# Patient Record
Sex: Male | Born: 1980 | Race: Black or African American | Hispanic: No | Marital: Single | Smoking: Former smoker
Health system: Southern US, Community
[De-identification: ages and names within clinical notes are randomized; demographics above are authoritative.]

## PROBLEM LIST (undated history)

## (undated) DIAGNOSIS — G8929 Other chronic pain: Secondary | ICD-10-CM

## (undated) DIAGNOSIS — W3400XA Accidental discharge from unspecified firearms or gun, initial encounter: Secondary | ICD-10-CM

## (undated) DIAGNOSIS — M25562 Pain in left knee: Secondary | ICD-10-CM

## (undated) HISTORY — PX: OTHER SURGICAL HISTORY: SHX169

---
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
## 2003-01-26 DIAGNOSIS — W3400XA Accidental discharge from unspecified firearms or gun, initial encounter: Secondary | ICD-10-CM

## 2003-01-26 HISTORY — PX: LEG SURGERY: SHX1003

## 2003-01-26 HISTORY — DX: Accidental discharge from unspecified firearms or gun, initial encounter: W34.00XA

## 2003-06-04 ENCOUNTER — Inpatient Hospital Stay (HOSPITAL_COMMUNITY): Admission: AC | Admit: 2003-06-04 | Discharge: 2003-06-07 | Payer: Self-pay

## 2003-06-04 ENCOUNTER — Encounter (INDEPENDENT_AMBULATORY_CARE_PROVIDER_SITE_OTHER): Payer: Self-pay | Admitting: Specialist

## 2004-02-21 ENCOUNTER — Emergency Department (HOSPITAL_COMMUNITY): Admission: EM | Admit: 2004-02-21 | Discharge: 2004-02-22 | Payer: Self-pay | Admitting: Emergency Medicine

## 2004-04-07 ENCOUNTER — Emergency Department (HOSPITAL_COMMUNITY): Admission: EM | Admit: 2004-04-07 | Discharge: 2004-04-07 | Payer: Self-pay | Admitting: Emergency Medicine

## 2004-05-20 ENCOUNTER — Emergency Department (HOSPITAL_COMMUNITY): Admission: EM | Admit: 2004-05-20 | Discharge: 2004-05-20 | Payer: Self-pay | Admitting: Emergency Medicine

## 2004-10-10 ENCOUNTER — Emergency Department (HOSPITAL_COMMUNITY): Admission: EM | Admit: 2004-10-10 | Discharge: 2004-10-10 | Payer: Self-pay | Admitting: Emergency Medicine

## 2004-11-20 ENCOUNTER — Emergency Department (HOSPITAL_COMMUNITY): Admission: EM | Admit: 2004-11-20 | Discharge: 2004-11-20 | Payer: Self-pay | Admitting: Emergency Medicine

## 2004-12-07 ENCOUNTER — Emergency Department (HOSPITAL_COMMUNITY): Admission: EM | Admit: 2004-12-07 | Discharge: 2004-12-07 | Payer: Self-pay | Admitting: Emergency Medicine

## 2005-01-02 ENCOUNTER — Emergency Department (HOSPITAL_COMMUNITY): Admission: EM | Admit: 2005-01-02 | Discharge: 2005-01-02 | Payer: Self-pay | Admitting: Emergency Medicine

## 2005-01-14 ENCOUNTER — Emergency Department (HOSPITAL_COMMUNITY): Admission: EM | Admit: 2005-01-14 | Discharge: 2005-01-14 | Payer: Self-pay | Admitting: Emergency Medicine

## 2005-02-17 ENCOUNTER — Emergency Department (HOSPITAL_COMMUNITY): Admission: EM | Admit: 2005-02-17 | Discharge: 2005-02-17 | Payer: Self-pay | Admitting: Emergency Medicine

## 2005-03-04 ENCOUNTER — Emergency Department (HOSPITAL_COMMUNITY): Admission: EM | Admit: 2005-03-04 | Discharge: 2005-03-04 | Payer: Self-pay | Admitting: Emergency Medicine

## 2005-04-08 ENCOUNTER — Emergency Department (HOSPITAL_COMMUNITY): Admission: EM | Admit: 2005-04-08 | Discharge: 2005-04-08 | Payer: Self-pay | Admitting: Emergency Medicine

## 2005-04-21 ENCOUNTER — Emergency Department (HOSPITAL_COMMUNITY): Admission: EM | Admit: 2005-04-21 | Discharge: 2005-04-21 | Payer: Self-pay | Admitting: Emergency Medicine

## 2005-07-15 ENCOUNTER — Emergency Department (HOSPITAL_COMMUNITY): Admission: EM | Admit: 2005-07-15 | Discharge: 2005-07-15 | Payer: Self-pay | Admitting: Emergency Medicine

## 2005-08-28 ENCOUNTER — Emergency Department (HOSPITAL_COMMUNITY): Admission: EM | Admit: 2005-08-28 | Discharge: 2005-08-28 | Payer: Self-pay | Admitting: Emergency Medicine

## 2005-09-12 ENCOUNTER — Emergency Department (HOSPITAL_COMMUNITY): Admission: EM | Admit: 2005-09-12 | Discharge: 2005-09-12 | Payer: Self-pay | Admitting: Emergency Medicine

## 2005-11-11 ENCOUNTER — Emergency Department (HOSPITAL_COMMUNITY): Admission: EM | Admit: 2005-11-11 | Discharge: 2005-11-11 | Payer: Self-pay | Admitting: Emergency Medicine

## 2005-12-09 ENCOUNTER — Emergency Department (HOSPITAL_COMMUNITY): Admission: EM | Admit: 2005-12-09 | Discharge: 2005-12-09 | Payer: Self-pay | Admitting: Emergency Medicine

## 2005-12-15 ENCOUNTER — Emergency Department (HOSPITAL_COMMUNITY): Admission: EM | Admit: 2005-12-15 | Discharge: 2005-12-15 | Payer: Self-pay | Admitting: Emergency Medicine

## 2006-01-07 ENCOUNTER — Emergency Department (HOSPITAL_COMMUNITY): Admission: EM | Admit: 2006-01-07 | Discharge: 2006-01-07 | Payer: Self-pay | Admitting: Emergency Medicine

## 2006-01-17 ENCOUNTER — Emergency Department (HOSPITAL_COMMUNITY): Admission: EM | Admit: 2006-01-17 | Discharge: 2006-01-17 | Payer: Self-pay | Admitting: Emergency Medicine

## 2006-02-22 ENCOUNTER — Emergency Department (HOSPITAL_COMMUNITY): Admission: EM | Admit: 2006-02-22 | Discharge: 2006-02-22 | Payer: Self-pay | Admitting: Emergency Medicine

## 2006-05-27 ENCOUNTER — Emergency Department (HOSPITAL_COMMUNITY): Admission: EM | Admit: 2006-05-27 | Discharge: 2006-05-27 | Payer: Self-pay | Admitting: Emergency Medicine

## 2006-08-14 ENCOUNTER — Emergency Department (HOSPITAL_COMMUNITY): Admission: EM | Admit: 2006-08-14 | Discharge: 2006-08-14 | Payer: Self-pay | Admitting: *Deleted

## 2006-12-26 ENCOUNTER — Emergency Department (HOSPITAL_COMMUNITY): Admission: EM | Admit: 2006-12-26 | Discharge: 2006-12-26 | Payer: Self-pay | Admitting: Emergency Medicine

## 2007-11-23 ENCOUNTER — Emergency Department (HOSPITAL_COMMUNITY): Admission: EM | Admit: 2007-11-23 | Discharge: 2007-11-23 | Payer: Self-pay | Admitting: Emergency Medicine

## 2009-11-07 ENCOUNTER — Emergency Department (HOSPITAL_COMMUNITY)
Admission: EM | Admit: 2009-11-07 | Discharge: 2009-11-07 | Payer: Self-pay | Source: Home / Self Care | Admitting: Family Medicine
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
## 2010-06-21 ENCOUNTER — Emergency Department (HOSPITAL_COMMUNITY)
Admission: EM | Admit: 2010-06-21 | Discharge: 2010-06-21 | Disposition: A | Discharge status: Home / Self Care | Payer: Self-pay | Attending: Emergency Medicine | Admitting: Emergency Medicine

## 2010-06-21 ENCOUNTER — Emergency Department (HOSPITAL_COMMUNITY): Payer: Self-pay

## 2010-06-21 DIAGNOSIS — M25569 Pain in unspecified knee: Secondary | ICD-10-CM | POA: Insufficient documentation

## 2010-06-21 DIAGNOSIS — X500XXA Overexertion from strenuous movement or load, initial encounter: Secondary | ICD-10-CM | POA: Insufficient documentation

## 2010-06-21 DIAGNOSIS — M25469 Effusion, unspecified knee: Secondary | ICD-10-CM | POA: Insufficient documentation

## 2011-03-26 ENCOUNTER — Emergency Department (HOSPITAL_COMMUNITY)
Admission: EM | Admit: 2011-03-26 | Discharge: 2011-03-26 | Disposition: A | Discharge status: Home / Self Care | Payer: Self-pay | Attending: Emergency Medicine | Admitting: Emergency Medicine

## 2011-03-26 ENCOUNTER — Encounter (HOSPITAL_COMMUNITY): Payer: Self-pay | Admitting: *Deleted

## 2011-03-26 DIAGNOSIS — IMO0001 Reserved for inherently not codable concepts without codable children: Secondary | ICD-10-CM

## 2011-03-26 DIAGNOSIS — Z139 Encounter for screening, unspecified: Secondary | ICD-10-CM | POA: Insufficient documentation
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
## 2011-03-26 NOTE — ED Notes (Signed)
NP to bedside
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
## 2011-03-26 NOTE — Discharge Instructions (Signed)
Safer Sex Your caregiver wants you to have this information about the infections that can be transmitted from sexual contact and how to prevent them. The idea behind safer sex is that you can be sexually active, and at the same time reduce the risk of giving or getting a sexually transmitted disease (STD). Every person should be aware of how to prevent him or herself and his or her sex partner from getting an STD. CAUSES OF STDS STDs are transmitted by sharing body fluids, which contain viruses and bacteria. The following fluids all transmit infections during sexual intercourse and sex acts:  Semen.   Saliva.   Urine.   Blood.   Vaginal mucus.  Examples of STDs include:  Chlamydia.   Gonorrhea.   Genital herpes.   Hepatitis B.   Human immunodeficiency virus or acquired immunodeficiency syndrome (HIV or AIDS).   Syphilis.   Trichomonas.   Pubic lice.   Human papillomavirus (HPV), which may include:   Genital warts.   Cervical dysplasia.   Cervical cancer (can develop with certain types of HPV).  SYMPTOMS  Sexual diseases often cause few or no symptoms until they are advanced, so a person can be infected and spread the infection without knowing it. Some STDs respond to treatment very well. Others, like HIV and herpes, cannot be cured, but are treated to reduce their effects. Specific symptoms include:  Abnormal vaginal discharge.   Irritation or itching in and around the vagina, and in the pubic hair.   Pain during sexual intercourse.   Bleeding during sexual intercourse.   Pelvic or abdominal pain.   Fever.   Growths in and around the vagina.   An ulcer in or around the vagina.   Swollen glands in the groin area.  DIAGNOSIS   Blood tests.   Pap test.   Culture test of abnormal vaginal discharge.   A test that applies a solution and examines the cervix with a lighted magnifying scope (colposcopy).   A test that examines the pelvis with a lighted  tube, through a small incision (laparoscopy).  TREATMENT  The treatment will depend on the cause of the STD.  Antibiotic treatment by injection, oral, creams, or suppositories in the vagina.   Over-the-counter medicated shampoo, to get rid of pubic lice.   Removing or treating growths with medicine, freezing, burning (electrocautery), or surgery.   Surgery treatment for HPV of the cervix.   Supportive medicines for herpes, HIV, AIDS, and hepatitis.  Being careful cannot eliminate all risk of infection, but sex can be made much safer. Safe sexual practices include body massage and gentle touching. Masturbation is safe, as long as body fluids do not contact skin that has sores or cuts. Dry kissing and oral sex on a man wearing a latex condom or on a woman wearing a male condom is also safe. Slightly less safe is intercourse while the man wears a latex condom or wet kissing. It is also safer to have one sex partner that you know is not having sex with anyone else. LENGTH OF ILLNESS An STD might be treated and cured in a week, sometimes a month, or more. And it can linger with symptoms for many years. STDs can also cause damage to the male organs. This can cause chronic pain, infertility, and recurrence of the STD, especially herpes, hepatitis, HIV, and HPV. HOME CARE INSTRUCTIONS AND PREVENTION  Alcohol and recreational drugs are often the reason given for not practicing safer sex. These substances affect  your judgment. Alcohol and recreational drugs can also impair your immune system, making you more vulnerable to disease.   Do not engage in risky and dangerous sexual practices, including:   Vaginal or anal sex without a condom.   Oral sex on a man without a condom.   Oral sex on a woman without a male condom.   Using saliva to lubricate a condom.   Any other sexual contact in which body fluids or blood from one partner contact the other partner.   You should use only latex  condoms for men and water soluble lubricants. Petroleum based lubricants or oils used to lubricate a condom will weaken the condom and increase the chance that it will break.   Think very carefully before having sex with anyone who is high risk for STDs and HIV. This includes IV drug users, people with multiple sexual partners, or people who have had an STD, or a positive hepatitis or HIV blood test.   Remember that even if your partner has had only one previous partner, their previous partner might have had multiple partners. If so, you are at high risk of being exposed to an STD. You and your sex partner should be the only sex partners with each other, with no one else involved.   A vaccine is available for hepatitis B and HPV through your caregiver or the Public Health Department. Everyone should be vaccinated with these vaccines.   Avoid risky sex practices. Sex acts that can break the skin make you more likely to get an STD.  SEEK MEDICAL CARE IF:   If you think you have an STD, even if you do not have any symptoms. Contact your caregiver for evaluation and treatment, if needed.   You think or know your sex partner has acquired an STD.   You have any of the symptoms mentioned above.  Document Released: 02/19/2004 Document Revised: 09/23/2010 Document Reviewed: 12/11/2008 [facility] Patient Information 2012 [place], [place].  RESOURCE GUIDE  Dental Problems  Patients with Medicaid: [facility] [phone] [address]                                           [phone] [address] Phone:  [phone]                                                  Phone:  [phone]  If unable to pay or uninsured, contact:  [facility] or [facility]. to become qualified for the adult dental clinic.  Chronic Pain Problems Contact [facility]  [phone] Patients need to be referred by their primary care  doctor.  Insufficient Money for Medicine Contact United Way:  call "211" or [facility] (986)080-6964.  No Primary Care Doctor Call [facility]  3644114281 Other agencies that provide inexpensive medical care    [facility]  [phone]    [facility]  [phone]    [facility]  432-039-9188    [facility]  [phone]    [facility]  331-310-8085  [facility]  9291125825  Psychological Services [facility]  [phone] [facility]  [phone] [facility]   [phone] (emergency services 631-786-1980)  Substance Abuse Resources [facility]  (445)588-5187 [facility] (720)496-0482 [facility] 228-594-7861 Floyd[facility] Flock 912-588-6016 Residential & Outpatient Substance Abuse Program  [phone]  Abuse/Neglect [facility] Child Abuse Hotline 803-625-8599 [facility] Child Abuse Hotline 820-576-2341 (After Hours)  Emergency Shelter [facility] [phone]  [facility] (605)535-8671 [facility] 863-673-9772  MRSA Hotline #:   7251115697    [facility] Resources  [facility] [place]     United Way                          [facility] Dept. [address] [place]                       [address]      [address]  Blondell Reveal Phone:  [phone]                                   Phone:  [phone]                 Phone:  [phone]  [facility] Phone:  [phone]  [facility] Child Abuse Hotline 306-581-4591 5417240162 (After Hours)

## 2011-03-27 NOTE — ED Provider Notes (Signed)
Medical screening examination/treatment/procedure(s) were performed by non-physician practitioner and as supervising physician I was immediately available for consultation/collaboration.  Teandre Hamre, MD 03/27/11 0058 

## 2011-04-10 ENCOUNTER — Encounter (HOSPITAL_COMMUNITY): Payer: Self-pay | Admitting: *Deleted

## 2011-04-10 ENCOUNTER — Emergency Department (HOSPITAL_COMMUNITY)
Admission: EM | Admit: 2011-04-10 | Discharge: 2011-04-11 | Disposition: A | Discharge status: Home / Self Care | Payer: Self-pay | Attending: Emergency Medicine | Admitting: Emergency Medicine

## 2011-04-10 DIAGNOSIS — F17200 Nicotine dependence, unspecified, uncomplicated: Secondary | ICD-10-CM | POA: Insufficient documentation

## 2011-04-10 DIAGNOSIS — Z043 Encounter for examination and observation following other accident: Secondary | ICD-10-CM | POA: Insufficient documentation

## 2011-04-10 DIAGNOSIS — M549 Dorsalgia, unspecified: Secondary | ICD-10-CM | POA: Insufficient documentation
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
## 2011-04-11 MED ORDER — OXYCODONE-ACETAMINOPHEN 5-325 MG PO TABS: 1.0000 | ORAL_TABLET | ORAL | Status: AC | PRN

## 2011-04-11 NOTE — Discharge Instructions (Signed)
RESOURCE GUIDE  Dental Problems  Patients with Medicaid: [facility]                     [facility] [address]                                           [address] Phone:  [phone]                                                  Phone:  [phone]  If unable to pay or uninsured, contact:  [facility] or [facility] to become qualified for the adult dental clinic.  Chronic Pain Problems Contact [facility]  [phone] Patients need to be referred by their primary care doctor.  Insufficient Money for Medicine Contact United Way:  call "211" or [facility] 271-5999.  No Primary Care Doctor Call [facility]  832-8000 Other agencies that provide inexpensive medical care    [facility]  [phone]    [facility]  [phone]    [facility]  [phone]    Women's Clinic  [phone]    [facility]  [phone]    [facility]  [phone]  Psychological Services [facility]  [phone] [facility]  [phone] [facility]   [phone] (emergency services 641-4993)  Substance Abuse Resources [facility]  336-882-2125 [facility] 336-784-9470 [facility] 336-285-9073 [facility] 336-845-3988 Residential & Outpatient Substance Abuse Program  800-659-3381  Abuse/Neglect [place] Child Abuse Hotline (336) 641-3795 [place] Child Abuse Hotline 800-378-5315 (After Hours)  Emergency Shelter [facility] (336) 271-5985  [facility] (336) 275-9566 [facility] (704) [phone]  MRSA Hotline #:   832-7006    [place] Resources  [facility] [place]     United Way                          [facility] [address] [place]                       [address]      [address]  [place]                                                [place]                            [place] Phone:  [phone]                                   Phone:  [phone]                 Phone:  [phone]  [facility] Phone:  [phone]    [place] Child Abuse Hotline (336) 342-1394 (336) 342-3537 (After Hours)    SEEK IMMEDIATE MEDICAL ATTENTION IF: New numbness, tingling, weakness, or problem with the use of your arms or legs.  Severe back pain not relieved with medications.  Change in bowel or bladder control (if you lose control of stool or urine, or if you are unable to urinate) Increasing pain in any areas of the body (such as chest or abdominal pain).  Shortness of breath, dizziness or fainting.  Nausea (feeling sick to your stomach), vomiting, fever, or sweats.  
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
## 2012-03-10 ENCOUNTER — Encounter (HOSPITAL_COMMUNITY): Payer: Self-pay

## 2012-03-10 ENCOUNTER — Emergency Department (HOSPITAL_COMMUNITY): Payer: Self-pay

## 2012-03-10 ENCOUNTER — Emergency Department (HOSPITAL_COMMUNITY)
Admission: EM | Admit: 2012-03-10 | Discharge: 2012-03-10 | Disposition: A | Discharge status: Home / Self Care | Payer: Self-pay | Attending: Emergency Medicine | Admitting: Emergency Medicine

## 2012-03-10 DIAGNOSIS — T304 Corrosion of unspecified body region, unspecified degree: Secondary | ICD-10-CM

## 2012-03-10 DIAGNOSIS — Y93E5 Activity, floor mopping and cleaning: Secondary | ICD-10-CM | POA: Insufficient documentation

## 2012-03-10 DIAGNOSIS — T23129A Burn of first degree of unspecified single finger (nail) except thumb, initial encounter: Secondary | ICD-10-CM | POA: Insufficient documentation

## 2012-03-10 DIAGNOSIS — IMO0002 Reserved for concepts with insufficient information to code with codable children: Secondary | ICD-10-CM | POA: Insufficient documentation

## 2012-03-10 DIAGNOSIS — Y92009 Unspecified place in unspecified non-institutional (private) residence as the place of occurrence of the external cause: Secondary | ICD-10-CM | POA: Insufficient documentation

## 2012-03-10 DIAGNOSIS — Z87828 Personal history of other (healed) physical injury and trauma: Secondary | ICD-10-CM | POA: Insufficient documentation

## 2012-03-10 DIAGNOSIS — F17200 Nicotine dependence, unspecified, uncomplicated: Secondary | ICD-10-CM | POA: Insufficient documentation
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
## 2012-03-13 NOTE — ED Provider Notes (Signed)
Medical screening examination/treatment/procedure(s) were performed by non-physician practitioner and as supervising physician I was immediately available for consultation/collaboration.  Raeford Razor, MD 03/13/12 [phone]

## 2012-09-14 ENCOUNTER — Encounter (HOSPITAL_COMMUNITY): Payer: Self-pay | Admitting: *Deleted

## 2012-09-14 ENCOUNTER — Emergency Department (HOSPITAL_COMMUNITY)
Admission: EM | Admit: 2012-09-14 | Discharge: 2012-09-14 | Disposition: A | Discharge status: Home / Self Care | Payer: Self-pay | Attending: Emergency Medicine | Admitting: Emergency Medicine

## 2012-09-14 ENCOUNTER — Emergency Department (HOSPITAL_COMMUNITY): Payer: Self-pay

## 2012-09-14 DIAGNOSIS — M25569 Pain in unspecified knee: Secondary | ICD-10-CM | POA: Insufficient documentation

## 2012-09-14 DIAGNOSIS — M25469 Effusion, unspecified knee: Secondary | ICD-10-CM | POA: Insufficient documentation

## 2012-09-14 DIAGNOSIS — M25562 Pain in left knee: Secondary | ICD-10-CM

## 2012-09-14 DIAGNOSIS — Z87828 Personal history of other (healed) physical injury and trauma: Secondary | ICD-10-CM | POA: Insufficient documentation

## 2012-09-14 DIAGNOSIS — F17200 Nicotine dependence, unspecified, uncomplicated: Secondary | ICD-10-CM | POA: Insufficient documentation

## 2012-09-14 HISTORY — DX: Accidental discharge from unspecified firearms or gun, initial encounter: W34.00XA

## 2012-09-14 MED ORDER — TRAMADOL HCL 50 MG PO TABS: 50.0000 mg | ORAL_TABLET | Freq: Four times a day (QID) | ORAL | Status: DC | PRN
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
## 2012-09-14 NOTE — ED Notes (Signed)
Pt states pain and swelling to L knee with weight bearing activity.  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

## 2012-09-16 NOTE — ED Provider Notes (Signed)
Medical screening examination/treatment/procedure(s) were performed by non-physician practitioner and as supervising physician I was immediately available for consultation/collaboration.  Luree Palla, MD 09/16/12 1604 

## 2012-12-15 ENCOUNTER — Emergency Department (HOSPITAL_COMMUNITY)
Admission: EM | Admit: 2012-12-15 | Discharge: 2012-12-15 | Disposition: A | Discharge status: Home / Self Care | Payer: Self-pay | Attending: Emergency Medicine | Admitting: Emergency Medicine

## 2012-12-15 ENCOUNTER — Encounter (HOSPITAL_COMMUNITY): Payer: Self-pay | Admitting: Emergency Medicine

## 2012-12-15 DIAGNOSIS — R9431 Abnormal electrocardiogram [ECG] [EKG]: Secondary | ICD-10-CM | POA: Insufficient documentation

## 2012-12-15 DIAGNOSIS — R209 Unspecified disturbances of skin sensation: Secondary | ICD-10-CM | POA: Insufficient documentation

## 2012-12-15 DIAGNOSIS — M79609 Pain in unspecified limb: Secondary | ICD-10-CM | POA: Insufficient documentation

## 2012-12-15 DIAGNOSIS — R200 Anesthesia of skin: Secondary | ICD-10-CM

## 2012-12-15 DIAGNOSIS — F17200 Nicotine dependence, unspecified, uncomplicated: Secondary | ICD-10-CM | POA: Insufficient documentation

## 2012-12-15 LAB — POCT I-STAT TROPONIN I: Troponin i, poc: 0 ng/mL (ref 0.00–0.08)

## 2012-12-15 LAB — POCT I-STAT, CHEM 8
BUN: 8 mg/dL (ref 6–23)
Calcium, Ion: 1.19 mmol/L (ref 1.12–1.23)
TCO2: 23 mmol/L (ref 0–100)
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
## 2012-12-15 NOTE — ED Notes (Signed)
To ED from home via [facility], c/o left arm numbness (no focal neuro deficits per EMS) and nausea after swallowing tabacco, VSS, A/O X4, ambulatory and in NAD
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
## 2013-02-27 ENCOUNTER — Emergency Department (HOSPITAL_COMMUNITY): Payer: Self-pay

## 2013-02-27 ENCOUNTER — Encounter (HOSPITAL_COMMUNITY): Payer: Self-pay | Admitting: Emergency Medicine

## 2013-02-27 ENCOUNTER — Emergency Department (HOSPITAL_COMMUNITY)
Admission: EM | Admit: 2013-02-27 | Discharge: 2013-02-27 | Disposition: A | Discharge status: Home / Self Care | Payer: Self-pay | Attending: Emergency Medicine | Admitting: Emergency Medicine

## 2013-02-27 DIAGNOSIS — M25462 Effusion, left knee: Secondary | ICD-10-CM

## 2013-02-27 DIAGNOSIS — IMO0001 Reserved for inherently not codable concepts without codable children: Secondary | ICD-10-CM | POA: Insufficient documentation

## 2013-02-27 DIAGNOSIS — M25469 Effusion, unspecified knee: Secondary | ICD-10-CM | POA: Insufficient documentation

## 2013-02-27 DIAGNOSIS — F17200 Nicotine dependence, unspecified, uncomplicated: Secondary | ICD-10-CM | POA: Insufficient documentation

## 2013-02-27 DIAGNOSIS — Z87828 Personal history of other (healed) physical injury and trauma: Secondary | ICD-10-CM | POA: Insufficient documentation

## 2013-02-27 MED ORDER — IBUPROFEN 800 MG PO TABS: 800.0000 mg | ORAL_TABLET | Freq: Three times a day (TID) | ORAL | Status: DC

## 2013-02-27 MED ORDER — HYDROCODONE-ACETAMINOPHEN 5-325 MG PO TABS
1.0000 | ORAL_TABLET | Freq: Once | ORAL | Status: AC
  Administered 2013-02-27: 1 via ORAL
  Filled 2013-02-27: qty 1

## 2013-02-27 MED ORDER — OXYCODONE-ACETAMINOPHEN 5-325 MG PO TABS: 2.0000 | ORAL_TABLET | ORAL | Status: DC | PRN

## 2013-02-27 NOTE — ED Provider Notes (Signed)
Medical screening examination/treatment/procedure(s) were performed by non-physician practitioner and as supervising physician I was immediately available for consultation/collaboration.  Shanna CiscoMegan E Ouida Abeyta, MD 02/27/13 2055

## 2013-02-27 NOTE — ED Provider Notes (Signed)
I was requested to perform therapeutic asperation of L knee.  After consent was obtained, using sterile technique the L knee was prepped and 3 ml's of 2% plain Lidocaine used to anesthetize the needle tract into the joint from the medial infrapatellar approach, and then also lateral infrapatellar approach. The knee joint was entered and 45 ml's of hay colored fluid mixed with bloody was withdrawn (30ml from lateral infrapetelar and 15ml from medial infrapattelar region). 6 ml plain Lidocaine was then injected and the needle withdrawn.  The procedure was well tolerated. This is a therapeutic tap, no fluid analysis performed. Does not look infected.  The patient is asked to continue to rest the knee for a few more days before resuming regular activities.  It may be more painful for the first 1-2 days.  Watch for fever, or increased swelling or persistent pain in knee. Call or return to clinic prn if such symptoms occur or the knee fails to improve as anticipated.   BP 128/76  Pulse 80  Temp(Src) 98.2 F (36.8 C) (Oral)  Resp 15  Ht 5\' 9"  (1.753 m)  Wt 145 lb (65.772 kg)  BMI 21.40 kg/m2  SpO2 99%  I have reviewed nursing notes and vital signs. I personally reviewed the imaging tests through PACS system  I reviewed available ER/hospitalization records thought the EMR  Results for orders placed during the hospital encounter of 12/15/12  POCT I-STAT, CHEM 8      Result Value Range   Sodium 139  135 - 145 mEq/L   Potassium 4.2  3.5 - 5.1 mEq/L   Chloride 102  96 - 112 mEq/L   BUN 8  6 - 23 mg/dL   Creatinine, Ser 1.611.20  0.50 - 1.35 mg/dL   Glucose, Bld 096116 (*) 70 - 99 mg/dL   Calcium, Ion 0.451.19  4.091.12 - 1.23 mmol/L   TCO2 23  0 - 100 mmol/L   Hemoglobin 15.6  13.0 - 17.0 g/dL   HCT 81.146.0  91.439.0 - 78.252.0 %  POCT I-STAT TROPONIN I      Result Value Range   Troponin i, poc 0.00  0.00 - 0.08 ng/mL   Comment 3            Dg Knee Complete 4 Views Left  02/27/2013   CLINICAL DATA:  Superior anterior  left knee pain no known injury  EXAM: LEFT KNEE - COMPLETE 4+ VIEW  COMPARISON:  DG KNEE COMPLETE 4 VIEWS*L* dated 09/14/2012  FINDINGS: There is no fracture or dislocation. There is mild narrowing and osteophyte formation of the medial and lateral compartments. There is mild to moderate patellofemoral osteophyte formation and narrowing. There is a moderate joint effusion.  IMPRESSION: Stable tricompartmental arthritis, mild to moderate, but advanced for patient age. Increase in the size of a knee joint effusion when compared to prior study.   Electronically Signed   By: Esperanza Heiraymond  Rubner M.D.   On: 02/27/2013 16:34      Fayrene HelperBowie Jewelia Bocchino, PA-C 02/27/13 1851
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
## 2013-02-27 NOTE — ED Notes (Addendum)
Pt reports left knee pain 3 months but only swelling 1 week ago. Pt can bear wt, sensation intact. Denies injury.

## 2013-02-27 NOTE — Discharge Instructions (Signed)
Please follow up closely with orthopedist specialist for further management of your knee pain and swelling.  Return if you develop fever, rash, increase redness or swelling or any signs of infection.    Knee Effusion The medical term for having fluid in your knee is effusion. This is often due to an internal derangement of the knee. This means something is wrong inside the knee. Some of the causes of fluid in the knee may be torn cartilage, a torn ligament, or bleeding into the joint from an injury. Your knee is likely more difficult to bend and move. This is often because there is increased pain and pressure in the joint. The time it takes for recovery from a knee effusion depends on different factors, including:   Type of injury.  Your age.  Physical and medical conditions.  Rehabilitation Strategies. How long you will be away from your normal activities will depend on what kind of knee problem you have and how much damage is present. Your knee has two types of cartilage. Articular cartilage covers the bone ends and lets your knee bend and move smoothly. Two menisci, thick pads of cartilage that form a rim inside the joint, help absorb shock and stabilize your knee. Ligaments bind the bones together and support your knee joint. Muscles move the joint, help support your knee, and take stress off the joint itself. CAUSES  Often an effusion in the knee is caused by an injury to one of the menisci. This is often a tear in the cartilage. Recovery after a meniscus injury depends on how much meniscus is damaged and whether you have damaged other knee tissue. Small tears may heal on their own with conservative treatment. Conservative means rest, limited weight bearing activity and muscle strengthening exercises. Your recovery may take up to 6 weeks.  TREATMENT  Larger tears may require surgery. Meniscus injuries may be treated during arthroscopy. Arthroscopy is a procedure in which your surgeon uses a  small telescope like instrument to look in your knee. Your caregiver can make a more accurate diagnosis (learning what is wrong) by performing an arthroscopic procedure. If your injury is on the inner margin of the meniscus, your surgeon may trim the meniscus back to a smooth rim. In other cases your surgeon will try to repair a damaged meniscus with stitches (sutures). This may make rehabilitation take longer, but may provide better long term result by helping your knee keep its shock absorption capabilities. Ligaments which are completely torn usually require surgery for repair. HOME CARE INSTRUCTIONS  Use crutches as instructed.  If a brace is applied, use as directed.  Once you are home, an ice pack applied to your swollen knee may help with discomfort and help decrease swelling.  Keep your knee raised (elevated) when you are not up and around or on crutches.  Only take over-the-counter or prescription medicines for pain, discomfort, or fever as directed by your caregiver.  Your caregivers will help with instructions for rehabilitation of your knee. This often includes strengthening exercises.  You may resume a normal diet and activities as directed. SEEK MEDICAL CARE IF:   There is increased swelling in your knee.  You notice redness, swelling, or increasing pain in your knee.  An unexplained oral temperature above 102 F (38.9 C) develops. SEEK IMMEDIATE MEDICAL CARE IF:   You develop a rash.  You have difficulty breathing.  You have any allergic reactions from medications you may have been given.  There is severe  pain with any motion of the knee. MAKE SURE YOU:   Understand these instructions.  Will watch your condition.  Will get help right away if you are not doing well or get worse. Document Released: 04/03/2003 Document Revised: 04/05/2011 Document Reviewed: 06/07/2007 [facility] Patient Information 2014 [place], [place].

## 2013-02-27 NOTE — ED Notes (Signed)
PA at bedside draining fluid off knee.

## 2013-02-28 NOTE — ED Provider Notes (Signed)
Medical screening examination/treatment/procedure(s) were performed by non-physician practitioner and as supervising physician I was immediately available for consultation/collaboration.  EKG Interpretation   None         Sabin Gibeault B. Tasheba Henson, MD 02/28/13 1617 

## 2013-06-29 ENCOUNTER — Encounter (HOSPITAL_COMMUNITY): Payer: Self-pay | Admitting: Emergency Medicine

## 2013-06-29 ENCOUNTER — Emergency Department (HOSPITAL_COMMUNITY)
Admission: EM | Admit: 2013-06-29 | Discharge: 2013-06-29 | Disposition: A | Discharge status: Home / Self Care | Payer: Self-pay | Attending: Emergency Medicine | Admitting: Emergency Medicine

## 2013-06-29 DIAGNOSIS — Z87828 Personal history of other (healed) physical injury and trauma: Secondary | ICD-10-CM | POA: Insufficient documentation

## 2013-06-29 DIAGNOSIS — R1310 Dysphagia, unspecified: Secondary | ICD-10-CM

## 2013-06-29 DIAGNOSIS — R1314 Dysphagia, pharyngoesophageal phase: Secondary | ICD-10-CM | POA: Insufficient documentation

## 2013-06-29 DIAGNOSIS — G8929 Other chronic pain: Secondary | ICD-10-CM | POA: Insufficient documentation

## 2013-06-29 DIAGNOSIS — R1319 Other dysphagia: Secondary | ICD-10-CM

## 2013-06-29 DIAGNOSIS — F17200 Nicotine dependence, unspecified, uncomplicated: Secondary | ICD-10-CM | POA: Insufficient documentation

## 2013-06-29 DIAGNOSIS — M25562 Pain in left knee: Secondary | ICD-10-CM

## 2013-06-29 DIAGNOSIS — N342 Other urethritis: Secondary | ICD-10-CM | POA: Insufficient documentation

## 2013-06-29 DIAGNOSIS — Z202 Contact with and (suspected) exposure to infections with a predominantly sexual mode of transmission: Secondary | ICD-10-CM

## 2013-06-29 DIAGNOSIS — M25569 Pain in unspecified knee: Secondary | ICD-10-CM | POA: Insufficient documentation

## 2013-06-29 HISTORY — DX: Other chronic pain: G89.29

## 2013-06-29 HISTORY — DX: Pain in left knee: M25.562

## 2013-06-29 MED ORDER — CEFTRIAXONE SODIUM 250 MG IJ SOLR
250.0000 mg | Freq: Once | INTRAMUSCULAR | Status: AC
  Administered 2013-06-29: 250 mg via INTRAMUSCULAR
  Filled 2013-06-29: qty 250

## 2013-06-29 MED ORDER — AZITHROMYCIN 1 G PO PACK
1.0000 g | PACK | Freq: Once | ORAL | Status: AC
  Administered 2013-06-29: 1 g via ORAL
  Filled 2013-06-29: qty 1

## 2013-06-29 MED ORDER — LIDOCAINE HCL (PF) 1 % IJ SOLN
INTRAMUSCULAR | Status: AC
  Filled 2013-06-29: qty 5
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
## 2013-06-29 NOTE — Discharge Instructions (Signed)
°Emergency Department Resource Guide °1) Find a Doctor and Pay Out of Pocket °Although you won't have to find out who is covered by your insurance plan, it is a good idea to ask around and get recommendations. You will then need to call the office and see if the doctor you have chosen will accept you as a new patient and what types of options they offer for patients who are self-pay. Some doctors offer discounts or will set up payment plans for their patients who do not have insurance, but you will need to ask so you aren't surprised when you get to your appointment. ° °2) Contact Your Local Health Department °Not all health departments have doctors that can see patients for sick visits, but many do, so it is worth a call to see if yours does. If you don't know where your local health department is, you can check in your phone book. The CDC also has a tool to help you locate your state's health department, and many state websites also have listings of all of their local health departments. ° °3) Find a Walk-in Clinic °If your illness is not likely to be very severe or complicated, you may want to try a walk in clinic. These are popping up all over the country in pharmacies, drugstores, and shopping centers. They're usually staffed by nurse practitioners or physician assistants that have been trained to treat common illnesses and complaints. They're usually fairly quick and inexpensive. However, if you have serious medical issues or chronic medical problems, these are probably not your best option. ° °No Primary Care Doctor: °- Call [facility] at  832-8000 - they can help you locate a primary care doctor that  accepts your insurance, provides certain services, etc. °- Physician Referral Service- 1-800-533-3463 ° °Chronic Pain Problems: °Organization         Address  Phone   Notes  °[facility]  (336) 297-2271 Patients need to be referred by their primary care doctor.  ° °Medication  Assistance: °Organization         Address  Phone   Notes  °[place] [facility] [address] °[place], [place] [address] (336) 641-8030 --Must be a resident of [place] °-- Must have NO insurance coverage whatsoever (no Medicaid/ Medicare, etc.) °-- The pt. MUST have a primary care doctor that directs their care regularly and follows them in the community °  °[facility]  (866) 331-1348   °[employer]  (888) 892-1162   ° °Agencies that provide inexpensive medical care: °Organization         Address  Phone   Notes  °[facility]  (336) 832-8035   °[facility]    (336) 832-7272   °[facility] [address] °[place], [place] [address] (336) 832-4777   °[facility] [address], °[place] (336) 271-4999   °[facility]    (336) 373-0678   °[facility]    (336) 272-1050   °[facility] ° [address], [place] Phone:  (336) 832-4444, Fax:  (336) 832-4440 Hours of Operation:  9 am - 6 pm, M-F.  Also accepts Medicaid/Medicare and self-pay.  °[facility] ° [address], [place] Phone: (336) 832-3150, Fax: (336) [phone]. Hours of Operation:  8:30 am - 5:30 pm, M-F.  Also accepts Medicaid and self-pay.  °[facility] [place] [address], [place] Phone: (336) 878-6027   °[facility] [address], [place], [place] (336)723-1848, Ext. 123 Mondays & Thursdays: 7-9 AM.  First 15 patients are seen on a first come, first serve basis. °  ° °Medicaid-accepting [place] Providers: ° °Organization         Address  Phone   Notes  °[facility] [address], [place] (336) 641-2100 Also accepts self-pay patients.  °[facility] [address], [place] ° (336) 856-9996   °[facility] [address], [place]  (336) 288-8857   °[facility] [address] [place] [address], [place] (336) 299-7000   °Veita Bland [address], [place]  ° (336) 373-1557 Only accepts [place] Access Medicaid patients after they have their name applied to their card.  ° °Self-Pay (no insurance) in [place]: ° °Organization         Address  Phone   Notes  °Sickle Cell Patients, [facility] [address], [place] (336) 832-1970   °[facility] [address], [place] (336) 832-4400   °[facility] ° [address], [place] (336) 992-4800   °[facility]/Dr. Osei-Bonsu ° [address] [place] [address], [place] or [address], [place] (336) 841-8500 Phone number for both [place] and [place] locations is the same.  °[facility] [address], [place] (336) 299-0000   °[facility] [address] [place] [address], [place] or [address] (336) 852-7530 °(336) 878-2260   °[facility] [address], [place] (336) 350-1642, phone; (336) 294-5005, fax Sees patients 1st and 3rd Saturday of every month.  Must not qualify for public or private insurance (i.e. Medicaid, Medicare, [place] Health Choice, Veterans' Benefits) • Household income should be no more than 200% of the poverty level •The clinic cannot treat you if you are pregnant or think you are pregnant • Sexually transmitted diseases are not treated at the clinic.  ° ° °Dental Care: °Organization         Address  Phone  Notes  °[place] [facility] [address], [place] (336) 641-6152 Accepts children up to age 21 who are enrolled in Medicaid or [place] Health Choice; pregnant women with a Medicaid card; and children who have applied for Medicaid or [place] Health Choice, but were declined, whose parents can pay a reduced fee at time of service.  °[place]  [facility] [place]  [address], [place] (336) 641-7733 Accepts children up to age 21 who are enrolled in Medicaid or [place] Health Choice; pregnant women with a Medicaid card; and children who have applied for Medicaid or [place] Health Choice, but were declined, whose parents can pay a reduced fee at time of service.  °[facility] ° [address], [place] (336) 641-4533 Patients are seen by appointment only. Walk-ins are not accepted. [facility] will see patients 18 years of age and older. °Monday - Tuesday (8am-5pm) °Most Wednesdays (8:30-5pm) °$30 per visit, cash only  °[facility] ° [address], [place] (336) 641-4533 Patients are seen by appointment only. Walk-ins are not accepted. [facility] will see patients 18 years of age and older. °One   Wednesday Evening (Monthly: Volunteer Based).  $30 per visit, cash only  °[facility]  (919) 537-3737 for adults; Children under age 4, call Graduate Pediatric Dentistry at (919) [phone]. Children aged 4-14, please call (919) [phone] to request a pediatric application. ° Dental services are provided in all areas of dental care including fillings, crowns and bridges, complete and partial dentures, implants, gum treatment, root canals, and extractions. Preventive care is also provided. Treatment is provided to both adults and children. °Patients are selected via a lottery and there is often a waiting list. °  °[facility] [address], °[place] ° (336) 763-8833 [email or web address] °  °[facility] [address], [place], [place] (336)723-1848, Ext. 123 Second and Fourth Thursday of each month, opens at 6:30 AM; Clinic ends at 9 AM.  Patients are seen on a first-come first-served basis, and a limited number are seen during each clinic.  ° °[facility] ° [address], [place], [place] (336) 723-7904    Eligibility Requirements °You must have lived in [place], [place], or [place] counties for at least the last three months. °  You cannot be eligible for state or federal sponsored healthcare insurance, including Veterans Administration, Medicaid, or Medicare. °  You generally cannot be eligible for healthcare insurance through your employer.  °  How to apply: °Eligibility screenings are held every Tuesday and Wednesday afternoon from 1:00 pm until 4:00 pm. You do not need an appointment for the interview!  °[facility] [address], [place], [place] 336-631-2330   °[facility]  336-342-8273   °[facility]  336-703-3100   °[facility]  336-570-6415   ° °Behavioral Health Resources in the Community: °Intensive Outpatient Programs °Organization         Address  Phone  Notes  °[facility] [address], [place], [place] 336-878-6098   °[facility] [address], [place], [place] 336-832-9800   °[facility] [address], [place], [place] ° 336-882-2125   °[facility] [address],  °[place], [place] 1-800-853-5163 or 336-641-4981   °Substance Abuse Resources °Organization         Address  Phone  Notes  °[facility]  336-882-2125   °[facility]  336-784-9470   °[facility]  336-285-9073   °[facility]  336-845-3988   °Residential & Outpatient Substance Abuse Program  1-800-659-3381   °Psychological Services °Organization         Address  Phone  Notes  °[facility]  336- 832-9600   °[facility]  336- 378-7881   °[facility] [address], [place] 1-800-853-5163 or 336-641-4981   ° °Mobile Crisis Teams °Organization         Address  Phone  Notes  °[facility], Mobile Crisis Care Unit  1-877-626-1772   °Assertive °[facility] ° [address]  [place], [place] 336-834-9664   °Sharon DeEsch [address] °[place] 336-554-5454   ° °Self-Help/Support Groups °Organization         Address  Phone             Notes  °[facility]  - variety of support groups  336- 373-1402 Call for more information  °Narcotics Anonymous (NA), [facility] [address], °[place]  2 meetings at this location  ° °  Residential Treatment Programs Organization         Address  Phone  Notes  [facility] [address],    [place]  7-829-562-1308   [facility]  [address] [place] [address], [place], [place] 962-952-8413   [facility] [address] [place], [place] 244-010-2725 Admissions: 8am-3pm M-F  [facility] [address]    [place], [place] 366-440-3474   [facility] [address] [place], [place], [place] 259-563-8756   [facility] [address] [place] [address]  [place], [place] 433-295-1884   [facility] Intensive Outpatient [address], [place], [place] 166-063-0160   [facility] [address] [place]  [place], [place] 1-093-235-5732 or (573)491-2527   [facility] [address] [place], [place] 376-283-1517 Accepts Medicaid  [facility] [address]  [place] 6-160-737-1062 Substance Abuse/Addiction Treatment   [facility]         Address  Phone  Notes  [facility]  (575) 574-2713   Angie Fava, PhD [address] [place], [place]   (409) 214-1217 or 607 060 2851   [facility]   [address] [place], [place] 507-261-2172   [facility] [address] [place], [place] 470-417-9312 Insurance/Medicaid/sponsorship through [facility] [address]                                    [place], [place] [phone] Therapy/tele-psych/case    [facility] [address], [place] (225) [phone]    Dr. Lolly Mustache  (534)[phone]   [facility]  [employer] [facility] Dept. 1) [address]  2) [address] [place] 3)  [address], [place] ([phone]) [phone]  [phone]   [facility] Child Abuse Hotline 712-749-7063 or 470-053-8101 (After Hours)       Your gonorrhea and chlamydia culture is pending results, and you will receive a phone call in the next several days if it is positive.  However, you were treated empirically today with antibiotics for both gonorrhea and chlamydia.  Call your regular medical doctor today to schedule a follow up appointment within the next week. You will also need to call the Orthopedic doctor to obtain follow up for your chronic knee pain, as well as the GI doctor for your feeling of "having food get stuck" when you swallow. Chew your food well and eat a soft diet until you are seen by the GI doctor. Return to the Emergency Department immediately if worsening.
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
## 2013-06-30 LAB — GC/CHLAMYDIA PROBE AMP
CT Probe RNA: NEGATIVE
GC PROBE AMP APTIMA: NEGATIVE

## 2013-12-14 ENCOUNTER — Encounter (HOSPITAL_COMMUNITY): Payer: Self-pay | Admitting: *Deleted

## 2013-12-14 ENCOUNTER — Emergency Department (HOSPITAL_COMMUNITY)
Admission: EM | Admit: 2013-12-14 | Discharge: 2013-12-14 | Disposition: A | Discharge status: Home / Self Care | Payer: Self-pay | Attending: Emergency Medicine | Admitting: Emergency Medicine

## 2013-12-14 DIAGNOSIS — Z87828 Personal history of other (healed) physical injury and trauma: Secondary | ICD-10-CM | POA: Insufficient documentation

## 2013-12-14 DIAGNOSIS — N5089 Other specified disorders of the male genital organs: Secondary | ICD-10-CM

## 2013-12-14 DIAGNOSIS — R369 Urethral discharge, unspecified: Secondary | ICD-10-CM

## 2013-12-14 DIAGNOSIS — N2889 Other specified disorders of kidney and ureter: Secondary | ICD-10-CM | POA: Insufficient documentation

## 2013-12-14 DIAGNOSIS — G8929 Other chronic pain: Secondary | ICD-10-CM | POA: Insufficient documentation

## 2013-12-14 DIAGNOSIS — Z720 Tobacco use: Secondary | ICD-10-CM | POA: Insufficient documentation

## 2013-12-14 LAB — URINALYSIS, ROUTINE W REFLEX MICROSCOPIC
Bilirubin Urine: NEGATIVE
GLUCOSE, UA: NEGATIVE mg/dL
Ketones, ur: NEGATIVE mg/dL
Nitrite: NEGATIVE
Protein, ur: NEGATIVE mg/dL
SPECIFIC GRAVITY, URINE: 1.02 (ref 1.005–1.030)
Urobilinogen, UA: 1 mg/dL (ref 0.0–1.0)
pH: 7.5 (ref 5.0–8.0)

## 2013-12-14 LAB — URINE MICROSCOPIC-ADD ON

## 2013-12-14 LAB — HIV ANTIBODY (ROUTINE TESTING W REFLEX): HIV 1&2 Ab, 4th Generation: NONREACTIVE

## 2013-12-14 LAB — RPR

## 2013-12-14 MED ORDER — DOXYCYCLINE HYCLATE 100 MG PO CAPS: 100.0000 mg | ORAL_CAPSULE | Freq: Two times a day (BID) | ORAL | Status: DC

## 2013-12-14 MED ORDER — STERILE WATER FOR INJECTION IJ SOLN
INTRAMUSCULAR | Status: AC
  Administered 2013-12-14: 0.9 mL
  Filled 2013-12-14: qty 10

## 2013-12-14 MED ORDER — CEFTRIAXONE SODIUM 250 MG IJ SOLR
250.0000 mg | Freq: Once | INTRAMUSCULAR | Status: AC
  Administered 2013-12-14: 250 mg via INTRAMUSCULAR
  Filled 2013-12-14: qty 250

## 2013-12-14 MED ORDER — AZITHROMYCIN 250 MG PO TABS
1000.0000 mg | ORAL_TABLET | Freq: Once | ORAL | Status: AC
  Administered 2013-12-14: 1000 mg via ORAL
  Filled 2013-12-14: qty 4

## 2013-12-14 NOTE — ED Notes (Signed)
Dysuria, says he has a blister at urinary meatus.
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
## 2013-12-17 LAB — HERPES SIMPLEX VIRUS CULTURE: CULTURE: NOT DETECTED

## 2013-12-18 LAB — GC/CHLAMYDIA PROBE AMP
CT PROBE, AMP APTIMA: NEGATIVE
GC PROBE AMP APTIMA: POSITIVE — AB

## 2013-12-19 ENCOUNTER — Telehealth (HOSPITAL_BASED_OUTPATIENT_CLINIC_OR_DEPARTMENT_OTHER): Payer: Self-pay | Admitting: *Deleted

## 2013-12-23 ENCOUNTER — Emergency Department (HOSPITAL_COMMUNITY): Payer: Self-pay

## 2013-12-23 ENCOUNTER — Emergency Department (HOSPITAL_COMMUNITY)
Admission: EM | Admit: 2013-12-23 | Discharge: 2013-12-23 | Disposition: A | Discharge status: Home / Self Care | Payer: Self-pay | Attending: Emergency Medicine | Admitting: Emergency Medicine

## 2013-12-23 ENCOUNTER — Encounter (HOSPITAL_COMMUNITY): Payer: Self-pay | Admitting: Emergency Medicine

## 2013-12-23 DIAGNOSIS — R0789 Other chest pain: Secondary | ICD-10-CM

## 2013-12-23 DIAGNOSIS — Z87828 Personal history of other (healed) physical injury and trauma: Secondary | ICD-10-CM | POA: Insufficient documentation

## 2013-12-23 DIAGNOSIS — R079 Chest pain, unspecified: Secondary | ICD-10-CM

## 2013-12-23 DIAGNOSIS — Z792 Long term (current) use of antibiotics: Secondary | ICD-10-CM | POA: Insufficient documentation

## 2013-12-23 DIAGNOSIS — Z720 Tobacco use: Secondary | ICD-10-CM | POA: Insufficient documentation

## 2013-12-23 DIAGNOSIS — G8929 Other chronic pain: Secondary | ICD-10-CM | POA: Insufficient documentation

## 2013-12-23 MED ORDER — IBUPROFEN 800 MG PO TABS
800.0000 mg | ORAL_TABLET | Freq: Once | ORAL | Status: AC
  Administered 2013-12-23: 800 mg via ORAL
  Filled 2013-12-23: qty 1

## 2013-12-23 MED ORDER — IBUPROFEN 800 MG PO TABS
800.0000 mg | ORAL_TABLET | Freq: Three times a day (TID) | ORAL | Status: DC | PRN
Start: 2013-12-23 — End: 2014-03-13

## 2013-12-23 MED ORDER — TRAMADOL HCL 50 MG PO TABS: 50.0000 mg | ORAL_TABLET | Freq: Four times a day (QID) | ORAL | Status: DC | PRN

## 2013-12-23 NOTE — ED Notes (Signed)
Patient c/o non-radiating mid-sternal chest pain. Per patient pain worse with movement and deep breath. Denies any coughing.
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
## 2013-12-23 NOTE — Discharge Instructions (Signed)

## 2013-12-24 ENCOUNTER — Telehealth (HOSPITAL_COMMUNITY): Payer: Self-pay

## 2013-12-28 ENCOUNTER — Telehealth: Payer: Self-pay | Admitting: *Deleted

## 2013-12-28 ENCOUNTER — Telehealth (HOSPITAL_COMMUNITY): Payer: Self-pay

## 2013-12-28 NOTE — Telephone Encounter (Signed)
returned call. verified ID. informed of labs. treated per protocol. advised to notify partner(s)

## 2014-03-12 ENCOUNTER — Encounter (HOSPITAL_COMMUNITY): Payer: Self-pay | Admitting: Emergency Medicine

## 2014-03-12 DIAGNOSIS — Y9389 Activity, other specified: Secondary | ICD-10-CM | POA: Insufficient documentation

## 2014-03-12 DIAGNOSIS — Y92410 Unspecified street and highway as the place of occurrence of the external cause: Secondary | ICD-10-CM | POA: Insufficient documentation

## 2014-03-12 DIAGNOSIS — Z792 Long term (current) use of antibiotics: Secondary | ICD-10-CM | POA: Insufficient documentation

## 2014-03-12 DIAGNOSIS — G8929 Other chronic pain: Secondary | ICD-10-CM | POA: Insufficient documentation

## 2014-03-12 DIAGNOSIS — Y998 Other external cause status: Secondary | ICD-10-CM | POA: Insufficient documentation

## 2014-03-12 DIAGNOSIS — S4992XA Unspecified injury of left shoulder and upper arm, initial encounter: Secondary | ICD-10-CM | POA: Insufficient documentation

## 2014-03-12 DIAGNOSIS — Z720 Tobacco use: Secondary | ICD-10-CM | POA: Insufficient documentation
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
## 2014-03-13 ENCOUNTER — Emergency Department (HOSPITAL_COMMUNITY): Payer: Self-pay

## 2014-03-13 ENCOUNTER — Emergency Department (HOSPITAL_COMMUNITY)
Admission: EM | Admit: 2014-03-13 | Discharge: 2014-03-13 | Disposition: A | Discharge status: Home / Self Care | Payer: Self-pay | Attending: Emergency Medicine | Admitting: Emergency Medicine

## 2014-03-13 DIAGNOSIS — M25512 Pain in left shoulder: Secondary | ICD-10-CM

## 2014-03-13 MED ORDER — CYCLOBENZAPRINE HCL 10 MG PO TABS: 10.0000 mg | ORAL_TABLET | Freq: Three times a day (TID) | ORAL | Status: DC | PRN

## 2014-03-13 MED ORDER — IBUPROFEN 800 MG PO TABS: 800.0000 mg | ORAL_TABLET | Freq: Three times a day (TID) | ORAL | Status: DC | PRN
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
## 2014-07-08 ENCOUNTER — Emergency Department (HOSPITAL_COMMUNITY)
Admission: EM | Admit: 2014-07-08 | Discharge: 2014-07-08 | Disposition: A | Discharge status: Home / Self Care | Payer: Self-pay | Attending: Emergency Medicine | Admitting: Emergency Medicine

## 2014-07-08 ENCOUNTER — Encounter (HOSPITAL_COMMUNITY): Payer: Self-pay | Admitting: Family Medicine

## 2014-07-08 DIAGNOSIS — X58XXXA Exposure to other specified factors, initial encounter: Secondary | ICD-10-CM | POA: Insufficient documentation

## 2014-07-08 DIAGNOSIS — Y9389 Activity, other specified: Secondary | ICD-10-CM | POA: Insufficient documentation

## 2014-07-08 DIAGNOSIS — R369 Urethral discharge, unspecified: Secondary | ICD-10-CM | POA: Insufficient documentation

## 2014-07-08 DIAGNOSIS — Z720 Tobacco use: Secondary | ICD-10-CM | POA: Insufficient documentation

## 2014-07-08 DIAGNOSIS — S29011A Strain of muscle and tendon of front wall of thorax, initial encounter: Secondary | ICD-10-CM | POA: Insufficient documentation

## 2014-07-08 DIAGNOSIS — T148XXA Other injury of unspecified body region, initial encounter: Secondary | ICD-10-CM

## 2014-07-08 DIAGNOSIS — Y9289 Other specified places as the place of occurrence of the external cause: Secondary | ICD-10-CM | POA: Insufficient documentation

## 2014-07-08 DIAGNOSIS — Z202 Contact with and (suspected) exposure to infections with a predominantly sexual mode of transmission: Secondary | ICD-10-CM | POA: Insufficient documentation

## 2014-07-08 DIAGNOSIS — Y998 Other external cause status: Secondary | ICD-10-CM | POA: Insufficient documentation

## 2014-07-08 DIAGNOSIS — G8929 Other chronic pain: Secondary | ICD-10-CM | POA: Insufficient documentation

## 2014-07-08 DIAGNOSIS — Z792 Long term (current) use of antibiotics: Secondary | ICD-10-CM | POA: Insufficient documentation

## 2014-07-08 MED ORDER — CEFTRIAXONE SODIUM 250 MG IJ SOLR
250.0000 mg | Freq: Once | INTRAMUSCULAR | Status: AC
  Administered 2014-07-08: 250 mg via INTRAMUSCULAR
  Filled 2014-07-08: qty 250

## 2014-07-08 MED ORDER — MELOXICAM 15 MG PO TABS: 15.0000 mg | ORAL_TABLET | Freq: Every day | ORAL | Status: DC

## 2014-07-08 MED ORDER — CYCLOBENZAPRINE HCL 10 MG PO TABS: 10.0000 mg | ORAL_TABLET | Freq: Two times a day (BID) | ORAL | Status: DC | PRN

## 2014-07-08 MED ORDER — STERILE WATER FOR INJECTION IJ SOLN
INTRAMUSCULAR | Status: AC
  Administered 2014-07-08: 0.9 mL
  Filled 2014-07-08: qty 10

## 2014-07-08 MED ORDER — AZITHROMYCIN 250 MG PO TABS
1000.0000 mg | ORAL_TABLET | Freq: Once | ORAL | Status: AC
  Administered 2014-07-08: 1000 mg via ORAL
  Filled 2014-07-08: qty 4

## 2014-07-08 NOTE — ED Notes (Signed)
Pt here for pain in right breastbone. Hurts with pressing and moving. sts started after work on Friday. sts also needs STD check.

## 2014-07-08 NOTE — Discharge Instructions (Signed)
Take mobic and flexeril for pain. Refer to attached documents for more information.
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
## 2014-07-09 LAB — HIV ANTIBODY (ROUTINE TESTING W REFLEX): HIV Screen 4th Generation wRfx: NONREACTIVE

## 2014-07-09 LAB — GC/CHLAMYDIA PROBE AMP (~~LOC~~) NOT AT ARMC
Chlamydia: NEGATIVE
Neisseria Gonorrhea: NEGATIVE

## 2015-03-17 ENCOUNTER — Emergency Department (HOSPITAL_COMMUNITY)
Admission: EM | Admit: 2015-03-17 | Discharge: 2015-03-18 | Disposition: A | Discharge status: Home / Self Care | Payer: Self-pay | Attending: Emergency Medicine | Admitting: Emergency Medicine

## 2015-03-17 ENCOUNTER — Encounter (HOSPITAL_COMMUNITY): Payer: Self-pay | Admitting: Emergency Medicine

## 2015-03-17 ENCOUNTER — Emergency Department (HOSPITAL_COMMUNITY): Payer: No Typology Code available for payment source

## 2015-03-17 DIAGNOSIS — G8929 Other chronic pain: Secondary | ICD-10-CM | POA: Insufficient documentation

## 2015-03-17 DIAGNOSIS — Z791 Long term (current) use of non-steroidal anti-inflammatories (NSAID): Secondary | ICD-10-CM | POA: Insufficient documentation

## 2015-03-17 DIAGNOSIS — R69 Illness, unspecified: Secondary | ICD-10-CM

## 2015-03-17 DIAGNOSIS — Z87828 Personal history of other (healed) physical injury and trauma: Secondary | ICD-10-CM | POA: Insufficient documentation

## 2015-03-17 DIAGNOSIS — Z792 Long term (current) use of antibiotics: Secondary | ICD-10-CM | POA: Insufficient documentation

## 2015-03-17 DIAGNOSIS — F17210 Nicotine dependence, cigarettes, uncomplicated: Secondary | ICD-10-CM | POA: Insufficient documentation

## 2015-03-17 DIAGNOSIS — J111 Influenza due to unidentified influenza virus with other respiratory manifestations: Secondary | ICD-10-CM | POA: Insufficient documentation

## 2015-03-17 LAB — CBC WITH DIFFERENTIAL/PLATELET
BASOS PCT: 0 %
Basophils Absolute: 0 10*3/uL (ref 0.0–0.1)
EOS ABS: 0 10*3/uL (ref 0.0–0.7)
EOS PCT: 0 %
HCT: 40.4 % (ref 39.0–52.0)
Hemoglobin: 13.8 g/dL (ref 13.0–17.0)
Lymphocytes Relative: 17 %
Lymphs Abs: 1.1 10*3/uL (ref 0.7–4.0)
MCH: 28 pg (ref 26.0–34.0)
MCHC: 34.2 g/dL (ref 30.0–36.0)
MCV: 82.1 fL (ref 78.0–100.0)
MONO ABS: 1 10*3/uL (ref 0.1–1.0)
Monocytes Relative: 16 %
NEUTROS ABS: 4.3 10*3/uL (ref 1.7–7.7)
Neutrophils Relative %: 67 %
PLATELETS: 210 10*3/uL (ref 150–400)
RBC: 4.92 MIL/uL (ref 4.22–5.81)
RDW: 13.5 % (ref 11.5–15.5)
WBC: 6.4 10*3/uL (ref 4.0–10.5)

## 2015-03-17 LAB — COMPREHENSIVE METABOLIC PANEL
ALBUMIN: 4.1 g/dL (ref 3.5–5.0)
ALK PHOS: 77 U/L (ref 38–126)
ALT: 20 U/L (ref 17–63)
AST: 25 U/L (ref 15–41)
Anion gap: 11 (ref 5–15)
BILIRUBIN TOTAL: 0.9 mg/dL (ref 0.3–1.2)
BUN: 14 mg/dL (ref 6–20)
CALCIUM: 9.1 mg/dL (ref 8.9–10.3)
CO2: 23 mmol/L (ref 22–32)
CREATININE: 1.23 mg/dL (ref 0.61–1.24)
Chloride: 99 mmol/L — ABNORMAL LOW (ref 101–111)
GFR calc Af Amer: >60 mL/min (ref >60)
GFR calc non Af Amer: >60 mL/min (ref >60)
GLUCOSE: 111 mg/dL — AB (ref 65–99)
Potassium: 3.5 mmol/L (ref 3.5–5.1)
Sodium: 133 mmol/L — ABNORMAL LOW (ref 135–145)
TOTAL PROTEIN: 7 g/dL (ref 6.5–8.1)

## 2015-03-17 LAB — I-STAT CG4 LACTIC ACID, ED: Lactic Acid, Venous: 0.94 mmol/L (ref 0.5–2.0)

## 2015-03-17 MED ORDER — ACETAMINOPHEN 325 MG PO TABS
650.0000 mg | ORAL_TABLET | Freq: Once | ORAL | Status: AC | PRN
  Administered 2015-03-17: 650 mg via ORAL

## 2015-03-17 MED ORDER — ACETAMINOPHEN 325 MG PO TABS
ORAL_TABLET | ORAL | Status: AC
  Filled 2015-03-17: qty 2

## 2015-03-17 NOTE — ED Notes (Signed)
Pt from home for eval of cough, headache, n/v and generalized body aches. Pt uncooperative in triage, significant other states pt has not been able to eat and just wants to lay down. Pt responds to voice but oriented x4.

## 2015-03-17 NOTE — ED Notes (Signed)
PA at bedside.

## 2015-03-18 MED ORDER — SODIUM CHLORIDE 0.9 % IV SOLN
1000.0000 mL | Freq: Once | INTRAVENOUS | Status: AC
  Administered 2015-03-18: 1000 mL via INTRAVENOUS

## 2015-03-18 MED ORDER — IBUPROFEN 800 MG PO TABS
800.0000 mg | ORAL_TABLET | Freq: Once | ORAL | Status: AC
  Administered 2015-03-18: 800 mg via ORAL
  Filled 2015-03-18: qty 1

## 2015-03-18 MED ORDER — ONDANSETRON HCL 4 MG/2ML IJ SOLN
4.0000 mg | Freq: Once | INTRAMUSCULAR | Status: AC
  Administered 2015-03-18: 4 mg via INTRAVENOUS
  Filled 2015-03-18: qty 2

## 2015-03-18 MED ORDER — ONDANSETRON 4 MG PO TBDP: 4.0000 mg | ORAL_TABLET | Freq: Three times a day (TID) | ORAL | Status: DC | PRN
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
## 2015-03-18 NOTE — Discharge Instructions (Signed)
Please read and follow all provided instructions.  Your diagnoses today include:  1. Influenza-like illness    Tests performed today include:  Chest x-ray - no pneumonia  Blood counts and electrolytes   Vital signs. See below for your results today.   Medications prescribed:   Zofran (ondansetron) - for nausea and vomiting  Take any prescribed medications only as directed.  Home care instructions:  Follow any educational materials contained in this packet. Please continue drinking plenty of fluids. Use over-the-counter cold and flu medications as needed as directed on packaging for symptom relief. You may also use ibuprofen or tylenol as directed on packaging for pain or fever.   BE VERY CAREFUL not to take multiple medicines containing Tylenol (also called acetaminophen). Doing so can lead to an overdose which can damage your liver and cause liver failure and possibly death.   Follow-up instructions: Please follow-up with your primary care provider in the next 3 days for further evaluation of your symptoms.   Return instructions:   Please return to the Emergency Department if you experience worsening symptoms.  Please return if you have a high fever greater than 101 degrees not controlled with over-the-counter medications, persistent vomiting and cannot keep down fluids, or worsening trouble breathing.  Please return if you have any other emergent concerns.  Additional Information:  Your vital signs today were: BP 103/61 mmHg   Pulse 59   Temp(Src) 98.9 F (37.2 C) (Oral)   Resp 20   SpO2 100% If your blood pressure (BP) was elevated above 135/85 this visit, please have this repeated by your doctor within one month.

## 2015-03-22 LAB — CULTURE, BLOOD (ROUTINE X 2): Culture: NO GROWTH

## 2015-12-24 ENCOUNTER — Encounter (HOSPITAL_COMMUNITY): Payer: Self-pay | Admitting: Emergency Medicine

## 2015-12-24 ENCOUNTER — Ambulatory Visit (HOSPITAL_COMMUNITY)
Admission: EM | Admit: 2015-12-24 | Discharge: 2015-12-24 | Disposition: A | Discharge status: Home / Self Care | Payer: Self-pay | Attending: Family Medicine | Admitting: Family Medicine

## 2015-12-24 DIAGNOSIS — R531 Weakness: Secondary | ICD-10-CM

## 2015-12-24 DIAGNOSIS — L84 Corns and callosities: Secondary | ICD-10-CM

## 2015-12-24 LAB — POCT I-STAT, CHEM 8
BUN: 8 mg/dL (ref 6–20)
CALCIUM ION: 1.18 mmol/L (ref 1.15–1.40)
CHLORIDE: 101 mmol/L (ref 101–111)
Creatinine, Ser: 1.1 mg/dL (ref 0.61–1.24)
GLUCOSE: 87 mg/dL (ref 65–99)
HCT: 44 % (ref 39.0–52.0)
Hemoglobin: 15 g/dL (ref 13.0–17.0)
Potassium: 4 mmol/L (ref 3.5–5.1)
Sodium: 140 mmol/L (ref 135–145)
TCO2: 27 mmol/L (ref 0–100)

## 2015-12-24 LAB — GLUCOSE, CAPILLARY: GLUCOSE-CAPILLARY: 90 mg/dL (ref 65–99)
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
## 2015-12-24 NOTE — Discharge Instructions (Signed)
See foot specialist if further problems

## 2015-12-24 NOTE — ED Triage Notes (Signed)
PT reports he has been increasingly sleepy throughout the day for the last month. PT also reports tingling in left arm for the last month. PT checked his sugar yesterday and it was 260 twice. PT is not a diabetic.

## 2016-01-22 ENCOUNTER — Encounter (HOSPITAL_COMMUNITY): Payer: Self-pay

## 2016-01-22 ENCOUNTER — Emergency Department (HOSPITAL_COMMUNITY)
Admission: EM | Admit: 2016-01-22 | Discharge: 2016-01-23 | Disposition: A | Discharge status: Home / Self Care | Payer: Self-pay | Attending: Emergency Medicine | Admitting: Emergency Medicine

## 2016-01-22 DIAGNOSIS — D649 Anemia, unspecified: Secondary | ICD-10-CM | POA: Insufficient documentation

## 2016-01-22 DIAGNOSIS — R739 Hyperglycemia, unspecified: Secondary | ICD-10-CM | POA: Insufficient documentation

## 2016-01-22 DIAGNOSIS — F17210 Nicotine dependence, cigarettes, uncomplicated: Secondary | ICD-10-CM | POA: Insufficient documentation

## 2016-01-22 LAB — CBC
HEMATOCRIT: 37.3 % — AB (ref 39.0–52.0)
Hemoglobin: 12.7 g/dL — ABNORMAL LOW (ref 13.0–17.0)
MCH: 27.5 pg (ref 26.0–34.0)
MCHC: 34 g/dL (ref 30.0–36.0)
MCV: 80.9 fL (ref 78.0–100.0)
PLATELETS: 271 10*3/uL (ref 150–400)
RBC: 4.61 MIL/uL (ref 4.22–5.81)
RDW: 13.4 % (ref 11.5–15.5)
WBC: 8.8 10*3/uL (ref 4.0–10.5)

## 2016-01-22 LAB — URINALYSIS, ROUTINE W REFLEX MICROSCOPIC
Bilirubin Urine: NEGATIVE
Glucose, UA: NEGATIVE mg/dL
Hgb urine dipstick: NEGATIVE
KETONES UR: NEGATIVE mg/dL
LEUKOCYTES UA: NEGATIVE
NITRITE: NEGATIVE
PH: 6 (ref 5.0–8.0)
PROTEIN: NEGATIVE mg/dL
Specific Gravity, Urine: 1.017 (ref 1.005–1.030)

## 2016-01-22 LAB — BASIC METABOLIC PANEL
ANION GAP: 8 (ref 5–15)
BUN: 10 mg/dL (ref 6–20)
CHLORIDE: 103 mmol/L (ref 101–111)
CO2: 24 mmol/L (ref 22–32)
Calcium: 8.6 mg/dL — ABNORMAL LOW (ref 8.9–10.3)
Creatinine, Ser: 0.94 mg/dL (ref 0.61–1.24)
Glucose, Bld: 123 mg/dL — ABNORMAL HIGH (ref 65–99)
POTASSIUM: 4 mmol/L (ref 3.5–5.1)
SODIUM: 135 mmol/L (ref 135–145)

## 2016-01-22 LAB — CBG MONITORING, ED
GLUCOSE-CAPILLARY: 100 mg/dL — AB (ref 65–99)
Glucose-Capillary: 140 mg/dL — ABNORMAL HIGH (ref 65–99)

## 2016-01-22 NOTE — ED Triage Notes (Signed)
Pt reports he has had high blood sugar using a home CBG meter and he wants to be checked for diabetes.

## 2016-01-22 NOTE — Discharge Instructions (Signed)
1. Medications: usual home medications 2. Treatment: rest, drink plenty of fluids, stop drinking sodas, did not add sugar tear food, eat low sugar foods 3. Follow Up: Please followup with your primary doctor in 2 days for discussion of your diagnoses and further evaluation after today's visit; if you do not have a primary care doctor use the resource guide provided to find one or call the [facility] as listed here in your discharge instructions to make an appointment for further evaluation of your elevated blood sugars; Please return to the ER for worsening symptoms including fever, chills, palpitations, increasing fatigue, increasing urination or thirst.
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
## 2016-03-07 ENCOUNTER — Encounter (HOSPITAL_COMMUNITY): Payer: Self-pay | Admitting: Emergency Medicine

## 2016-03-07 ENCOUNTER — Emergency Department (HOSPITAL_COMMUNITY)
Admission: EM | Admit: 2016-03-07 | Discharge: 2016-03-07 | Disposition: A | Discharge status: Home / Self Care | Payer: Self-pay | Attending: Emergency Medicine | Admitting: Emergency Medicine

## 2016-03-07 DIAGNOSIS — H109 Unspecified conjunctivitis: Secondary | ICD-10-CM | POA: Insufficient documentation

## 2016-03-07 DIAGNOSIS — F17210 Nicotine dependence, cigarettes, uncomplicated: Secondary | ICD-10-CM | POA: Insufficient documentation

## 2016-03-07 MED ORDER — TETRACAINE HCL 0.5 % OP SOLN
2.0000 [drp] | Freq: Once | OPHTHALMIC | Status: AC
  Administered 2016-03-07: 2 [drp] via OPHTHALMIC
  Filled 2016-03-07: qty 4

## 2016-03-07 MED ORDER — FLUORESCEIN SODIUM 0.6 MG OP STRP
1.0000 | ORAL_STRIP | Freq: Once | OPHTHALMIC | Status: AC
  Administered 2016-03-07: 1 via OPHTHALMIC
  Filled 2016-03-07: qty 1

## 2016-03-07 MED ORDER — TOBRAMYCIN 0.3 % OP SOLN
OPHTHALMIC | Status: AC
  Filled 2016-03-07: qty 5

## 2016-03-07 MED ORDER — TRAMADOL HCL 50 MG PO TABS: ORAL_TABLET | ORAL | 0 refills | Status: DC

## 2016-03-07 MED ORDER — TOBRAMYCIN 0.3 % OP SOLN
2.0000 [drp] | OPHTHALMIC | Status: DC
  Administered 2016-03-07: 2 [drp] via OPHTHALMIC

## 2016-03-07 NOTE — Discharge Instructions (Signed)
Your examination suggest conjunctivitis. Please use tobramycin 2 drops to the right eye every 4 hours for 5 days. Please wash hands frequently. Use a cool compress to your eye 3 or 4 times daily. Dark glasses and a hat with brim may be helpful. Try to avoid changing light brightness is much as possible.
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
## 2016-03-07 NOTE — ED Triage Notes (Signed)
Patient complains of right eye pain that started over a week ago.

## 2016-06-09 ENCOUNTER — Encounter (HOSPITAL_COMMUNITY): Payer: Self-pay | Admitting: *Deleted

## 2016-06-09 ENCOUNTER — Emergency Department (HOSPITAL_COMMUNITY)
Admission: EM | Admit: 2016-06-09 | Discharge: 2016-06-09 | Disposition: A | Discharge status: Home / Self Care | Payer: Self-pay | Attending: Emergency Medicine | Admitting: Emergency Medicine

## 2016-06-09 DIAGNOSIS — R310 Gross hematuria: Secondary | ICD-10-CM | POA: Insufficient documentation

## 2016-06-09 DIAGNOSIS — F17210 Nicotine dependence, cigarettes, uncomplicated: Secondary | ICD-10-CM | POA: Insufficient documentation

## 2016-06-09 LAB — URINALYSIS, ROUTINE W REFLEX MICROSCOPIC
BACTERIA UA: NONE SEEN
BILIRUBIN URINE: NEGATIVE
Glucose, UA: NEGATIVE mg/dL
KETONES UR: NEGATIVE mg/dL
Nitrite: NEGATIVE
PH: 5 (ref 5.0–8.0)
PROTEIN: 100 mg/dL — AB
SQUAMOUS EPITHELIAL / LPF: NONE SEEN
Specific Gravity, Urine: 1.026 (ref 1.005–1.030)

## 2016-06-09 MED ORDER — IBUPROFEN 800 MG PO TABS
800.0000 mg | ORAL_TABLET | Freq: Once | ORAL | Status: AC
  Administered 2016-06-09: 800 mg via ORAL
  Filled 2016-06-09: qty 1

## 2016-06-09 MED ORDER — CEPHALEXIN 500 MG PO CAPS
500.0000 mg | ORAL_CAPSULE | Freq: Once | ORAL | Status: AC
  Administered 2016-06-09: 500 mg via ORAL
  Filled 2016-06-09: qty 1

## 2016-06-09 MED ORDER — CEPHALEXIN 500 MG PO CAPS: 500.0000 mg | ORAL_CAPSULE | Freq: Two times a day (BID) | ORAL | 0 refills | Status: DC

## 2016-06-09 NOTE — ED Triage Notes (Signed)
Pt c/o blood in urine that started today

## 2016-06-09 NOTE — ED Notes (Signed)
Pt alert & oriented x4, stable gait. Patient given discharge instructions, paperwork & prescription(s). Patient  instructed to stop at the registration desk to finish any additional paperwork. Patient verbalized understanding. Pt left department w/ no further questions. 
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
## 2016-06-11 LAB — URINE CULTURE

## 2017-03-14 ENCOUNTER — Emergency Department (HOSPITAL_COMMUNITY)
Admission: EM | Admit: 2017-03-14 | Discharge: 2017-03-14 | Disposition: A | Discharge status: Home / Self Care | Payer: Self-pay | Attending: Emergency Medicine | Admitting: Emergency Medicine

## 2017-03-14 ENCOUNTER — Encounter (HOSPITAL_COMMUNITY): Payer: Self-pay | Admitting: Emergency Medicine

## 2017-03-14 ENCOUNTER — Other Ambulatory Visit: Payer: Self-pay

## 2017-03-14 DIAGNOSIS — W57XXXA Bitten or stung by nonvenomous insect and other nonvenomous arthropods, initial encounter: Secondary | ICD-10-CM | POA: Insufficient documentation

## 2017-03-14 DIAGNOSIS — Y999 Unspecified external cause status: Secondary | ICD-10-CM | POA: Insufficient documentation

## 2017-03-14 DIAGNOSIS — S60562A Insect bite (nonvenomous) of left hand, initial encounter: Secondary | ICD-10-CM | POA: Insufficient documentation

## 2017-03-14 DIAGNOSIS — Y929 Unspecified place or not applicable: Secondary | ICD-10-CM | POA: Insufficient documentation

## 2017-03-14 DIAGNOSIS — Y939 Activity, unspecified: Secondary | ICD-10-CM | POA: Insufficient documentation

## 2017-03-14 DIAGNOSIS — F17210 Nicotine dependence, cigarettes, uncomplicated: Secondary | ICD-10-CM | POA: Insufficient documentation

## 2017-03-14 MED ORDER — SULFAMETHOXAZOLE-TRIMETHOPRIM 800-160 MG PO TABS: 1.0000 | ORAL_TABLET | Freq: Two times a day (BID) | ORAL | 0 refills | Status: AC

## 2017-03-14 MED ORDER — SULFAMETHOXAZOLE-TRIMETHOPRIM 800-160 MG PO TABS
1.0000 | ORAL_TABLET | Freq: Once | ORAL | Status: AC
  Administered 2017-03-14: 1 via ORAL
  Filled 2017-03-14: qty 1

## 2017-03-14 NOTE — ED Triage Notes (Signed)
Pt states thinks he was bit by a spider on his hand last Monday. Started with swelling and pus drainage Wednesday of last week.

## 2017-03-14 NOTE — Discharge Instructions (Signed)
Warm warm soaks 2-3 times a day.  If the area begins to drain, keep it bandaged.  Tylenol or ibuprofen if needed for pain.  Return here for any worsening symptoms such as increasing pain, redness or swelling.
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
## 2017-09-09 ENCOUNTER — Emergency Department (HOSPITAL_COMMUNITY): Payer: Self-pay

## 2017-09-09 ENCOUNTER — Emergency Department (HOSPITAL_COMMUNITY)
Admission: EM | Admit: 2017-09-09 | Discharge: 2017-09-09 | Disposition: A | Discharge status: Home / Self Care | Payer: Self-pay | Attending: Emergency Medicine | Admitting: Emergency Medicine

## 2017-09-09 ENCOUNTER — Encounter (HOSPITAL_COMMUNITY): Payer: Self-pay

## 2017-09-09 DIAGNOSIS — M791 Myalgia, unspecified site: Secondary | ICD-10-CM | POA: Insufficient documentation

## 2017-09-09 DIAGNOSIS — R109 Unspecified abdominal pain: Secondary | ICD-10-CM

## 2017-09-09 DIAGNOSIS — M7918 Myalgia, other site: Secondary | ICD-10-CM

## 2017-09-09 DIAGNOSIS — R1084 Generalized abdominal pain: Secondary | ICD-10-CM | POA: Insufficient documentation

## 2017-09-09 LAB — URINALYSIS, ROUTINE W REFLEX MICROSCOPIC
Bilirubin Urine: NEGATIVE
GLUCOSE, UA: NEGATIVE mg/dL
HGB URINE DIPSTICK: NEGATIVE
Ketones, ur: NEGATIVE mg/dL
LEUKOCYTES UA: NEGATIVE
Nitrite: NEGATIVE
PROTEIN: NEGATIVE mg/dL
SPECIFIC GRAVITY, URINE: 1.017 (ref 1.005–1.030)
pH: 6 (ref 5.0–8.0)

## 2017-09-09 MED ORDER — ONDANSETRON HCL 4 MG PO TABS
4.0000 mg | ORAL_TABLET | Freq: Once | ORAL | Status: AC
  Administered 2017-09-09: 4 mg via ORAL
  Filled 2017-09-09: qty 1

## 2017-09-09 MED ORDER — IBUPROFEN 800 MG PO TABS
800.0000 mg | ORAL_TABLET | Freq: Once | ORAL | Status: AC
  Administered 2017-09-09: 800 mg via ORAL
  Filled 2017-09-09: qty 1

## 2017-09-09 MED ORDER — ACETAMINOPHEN 500 MG PO TABS
1000.0000 mg | ORAL_TABLET | Freq: Once | ORAL | Status: AC
  Administered 2017-09-09: 1000 mg via ORAL
  Filled 2017-09-09: qty 2

## 2017-09-09 MED ORDER — METHOCARBAMOL 500 MG PO TABS: 500.0000 mg | ORAL_TABLET | Freq: Three times a day (TID) | ORAL | 0 refills | Status: DC

## 2017-09-09 MED ORDER — IBUPROFEN 600 MG PO TABS: 600.0000 mg | ORAL_TABLET | Freq: Four times a day (QID) | ORAL | 0 refills | Status: DC

## 2017-09-09 NOTE — Discharge Instructions (Addendum)
Your rib film is negative for fracture or dislocation.  Your chest x-ray is negative for any injury or problem with your lungs.  Your urine test is negative for infection, and or signs of kidney stone.  Please use a heating pad to the area.  I suspect that this is a musculoskeletal type pain.  Please use Robaxin 3 times daily for spasm pain, please use ibuprofen with breakfast, lunch, dinner, and at bedtime.  Robaxin may cause drowsiness, and/or lightheadedness.  Please do not drive a vehicle, operate machinery, drink alcohol, or participate in activities requiring concentration when taking this medication.

## 2017-09-09 NOTE — ED Triage Notes (Signed)
Pt reports that has right sided pain that started yesterday while he was at work . 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 States area looked swollen and feels like spams

## 2017-09-09 NOTE — ED Notes (Signed)
Pt was informed that we need a urine sample. Pt states that he can not urinate at this time. 
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
## 2019-01-23 ENCOUNTER — Telehealth: Payer: Self-pay

## 2019-01-23 NOTE — Telephone Encounter (Signed)
Pt. Eligibility for [facility] is 01/23/2019 till 01/23/2020. Pt. Needs for [facility] Paystubs with name on them.  Jesse Daniels

## 2019-02-13 ENCOUNTER — Other Ambulatory Visit: Payer: Self-pay

## 2019-02-13 ENCOUNTER — Other Ambulatory Visit (HOSPITAL_COMMUNITY)
Admission: RE | Admit: 2019-02-13 | Discharge: 2019-02-13 | Disposition: A | Discharge status: Home / Self Care | Payer: Self-pay | Source: Ambulatory Visit | Attending: Physician Assistant | Admitting: Physician Assistant

## 2019-02-13 ENCOUNTER — Ambulatory Visit: Payer: Medicaid Other | Admitting: Physician Assistant

## 2019-02-13 ENCOUNTER — Encounter: Payer: Self-pay | Admitting: Physician Assistant

## 2019-02-13 VITALS — BP 106/70 | HR 64 | Temp 97.9°F | Wt 142.8 lb

## 2019-02-13 DIAGNOSIS — Z13220 Encounter for screening for lipoid disorders: Secondary | ICD-10-CM

## 2019-02-13 DIAGNOSIS — Z7689 Persons encountering health services in other specified circumstances: Secondary | ICD-10-CM

## 2019-02-13 DIAGNOSIS — Z862 Personal history of diseases of the blood and blood-forming organs and certain disorders involving the immune mechanism: Secondary | ICD-10-CM

## 2019-02-13 DIAGNOSIS — F1290 Cannabis use, unspecified, uncomplicated: Secondary | ICD-10-CM

## 2019-02-13 DIAGNOSIS — R69 Illness, unspecified: Secondary | ICD-10-CM | POA: Insufficient documentation

## 2019-02-13 DIAGNOSIS — F17200 Nicotine dependence, unspecified, uncomplicated: Secondary | ICD-10-CM

## 2019-02-13 NOTE — Progress Notes (Signed)
BP 106/70   Pulse 64   Temp 97.9 F (36.6 C)   Wt 142 lb 12.8 oz (64.8 kg)   SpO2 99%   BMI 20.49 kg/m    Subjective:    Patient ID: Jesse Daniels, male    DOB: May 02, 1980, 39 y.o.   MRN: [id number]  HPI: QUADIR MUNS is a 39 y.o. male presenting on 02/13/2019 for New Patient (Initial Visit)   HPI   Pt had a negative covid 73 screening questionnaire   Pt is a 39yoM who presents to office today to establish care.  He says he is well and has no complaints, he just wants to establish with PCP.   He Works as a Glass blower/designer.  He smokes about 1/2 ppd cigarettes and is a daily smoker of MJ.  He exercises sometimes but not regularly.  Both of his parents are alive and well.      Relevant past medical, surgical, family and social history reviewed and updated as indicated. Interim medical history since our last visit reviewed. Allergies and medications reviewed and updated.  No current outpatient medications on file.    Review of Systems  Per HPI unless specifically indicated above     Objective:    BP 106/70   Pulse 64   Temp 97.9 F (36.6 C)   Wt 142 lb 12.8 oz (64.8 kg)   SpO2 99%   BMI 20.49 kg/m   Wt Readings from Last 3 Encounters:  02/13/19 142 lb 12.8 oz (64.8 kg)  09/09/17 145 lb (65.8 kg)  03/14/17 143 lb (64.9 kg)    Physical Exam Vitals reviewed.  Constitutional:      General: He is not in acute distress.    Appearance: Normal appearance. He is well-developed and normal weight. He is not ill-appearing.  HENT:     Head: Normocephalic and atraumatic.  Eyes:     Conjunctiva/sclera: Conjunctivae normal.     Pupils: Pupils are equal, round, and reactive to light.  Neck:     Thyroid: No thyromegaly.  Cardiovascular:     Rate and Rhythm: Normal rate and regular rhythm.  Pulmonary:     Effort: Pulmonary effort is normal.     Breath sounds: Normal breath sounds. No wheezing or rales.  Abdominal:     General: Bowel sounds are normal.   Palpations: Abdomen is soft. There is no mass.     Tenderness: There is no abdominal tenderness.  Musculoskeletal:     Cervical back: Neck supple.     Right lower leg: No edema.     Left lower leg: No edema.  Lymphadenopathy:     Cervical: No cervical adenopathy.  Skin:    General: Skin is warm and dry.     Findings: No rash.  Neurological:     Mental Status: He is alert and oriented to person, place, and time.  Psychiatric:        Attention and Perception: Attention normal.        Mood and Affect: Mood normal.        Speech: Speech normal.        Behavior: Behavior normal.     Comments: Very pleasant and personable          Assessment & Plan:    Encounter Diagnoses  Name Primary?  . Encounter to establish care Yes  . Screening cholesterol level   . History of anemia   . Tobacco use disorder   . Marijuana use     -  will Check baseline labs and call pt with results -counseled smoking cessation, regular exercise, wearing condoms to limit STDs and unwanted pregnancies, and limiting MJ to no more that once or twice/week -pt to follow up 1 year.  He is to contact office sooner prn

## 2019-02-13 NOTE — Patient Instructions (Signed)

## 2019-05-07 ENCOUNTER — Emergency Department (HOSPITAL_COMMUNITY): Payer: Medicaid Other

## 2019-05-07 ENCOUNTER — Encounter (HOSPITAL_COMMUNITY): Payer: Self-pay | Admitting: Emergency Medicine

## 2019-05-07 ENCOUNTER — Emergency Department (HOSPITAL_COMMUNITY)
Admission: EM | Admit: 2019-05-07 | Discharge: 2019-05-08 | Disposition: A | Discharge status: Home / Self Care | Payer: Medicaid Other | Attending: Emergency Medicine | Admitting: Emergency Medicine

## 2019-05-07 ENCOUNTER — Other Ambulatory Visit: Payer: Self-pay

## 2019-05-07 DIAGNOSIS — W19XXXA Unspecified fall, initial encounter: Secondary | ICD-10-CM

## 2019-05-07 DIAGNOSIS — W01198A Fall on same level from slipping, tripping and stumbling with subsequent striking against other object, initial encounter: Secondary | ICD-10-CM | POA: Insufficient documentation

## 2019-05-07 DIAGNOSIS — F17210 Nicotine dependence, cigarettes, uncomplicated: Secondary | ICD-10-CM | POA: Insufficient documentation

## 2019-05-07 DIAGNOSIS — M25562 Pain in left knee: Secondary | ICD-10-CM | POA: Insufficient documentation

## 2019-05-07 MED ORDER — HYDROCODONE-ACETAMINOPHEN 5-325 MG PO TABS
1.0000 | ORAL_TABLET | Freq: Once | ORAL | Status: AC
  Administered 2019-05-07: 1 via ORAL
  Filled 2019-05-07: qty 1

## 2019-05-07 MED ORDER — IBUPROFEN 800 MG PO TABS
800.0000 mg | ORAL_TABLET | Freq: Once | ORAL | Status: AC
  Administered 2019-05-07: 800 mg via ORAL
  Filled 2019-05-07: qty 1

## 2019-05-07 NOTE — ED Triage Notes (Signed)
Pt states he was fishing yesterday and he slipped and fell on his left knee.

## 2019-05-08 MED ORDER — DICLOFENAC SODIUM 75 MG PO TBEC: 75.0000 mg | DELAYED_RELEASE_TABLET | Freq: Two times a day (BID) | ORAL | 0 refills | Status: DC

## 2019-05-08 NOTE — Discharge Instructions (Signed)
Apply ice packs on and off to your knee.  Wear the knee brace with walking or standing.  You may remove at bedtime and for bathing.  Use the crutches as needed.  Call Dr. Mort Sawyers office to arrange a follow-up appointment.
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
## 2020-01-28 ENCOUNTER — Other Ambulatory Visit: Payer: Self-pay | Admitting: Physician Assistant

## 2020-01-28 DIAGNOSIS — Z862 Personal history of diseases of the blood and blood-forming organs and certain disorders involving the immune mechanism: Secondary | ICD-10-CM

## 2020-01-28 DIAGNOSIS — Z13220 Encounter for screening for lipoid disorders: Secondary | ICD-10-CM

## 2020-02-13 ENCOUNTER — Ambulatory Visit: Payer: Medicaid Other | Admitting: Physician Assistant

## 2020-02-19 ENCOUNTER — Encounter: Payer: Self-pay | Admitting: Physician Assistant

## 2020-11-10 IMAGING — DX DG KNEE COMPLETE 4+V*L*
4 series · 4 of 4 positions shown · non-contrast
Comparison: None.

CLINICAL DATA: Fall

EXAM:
LEFT KNEE - COMPLETE 4+ VIEW

[knee ap]
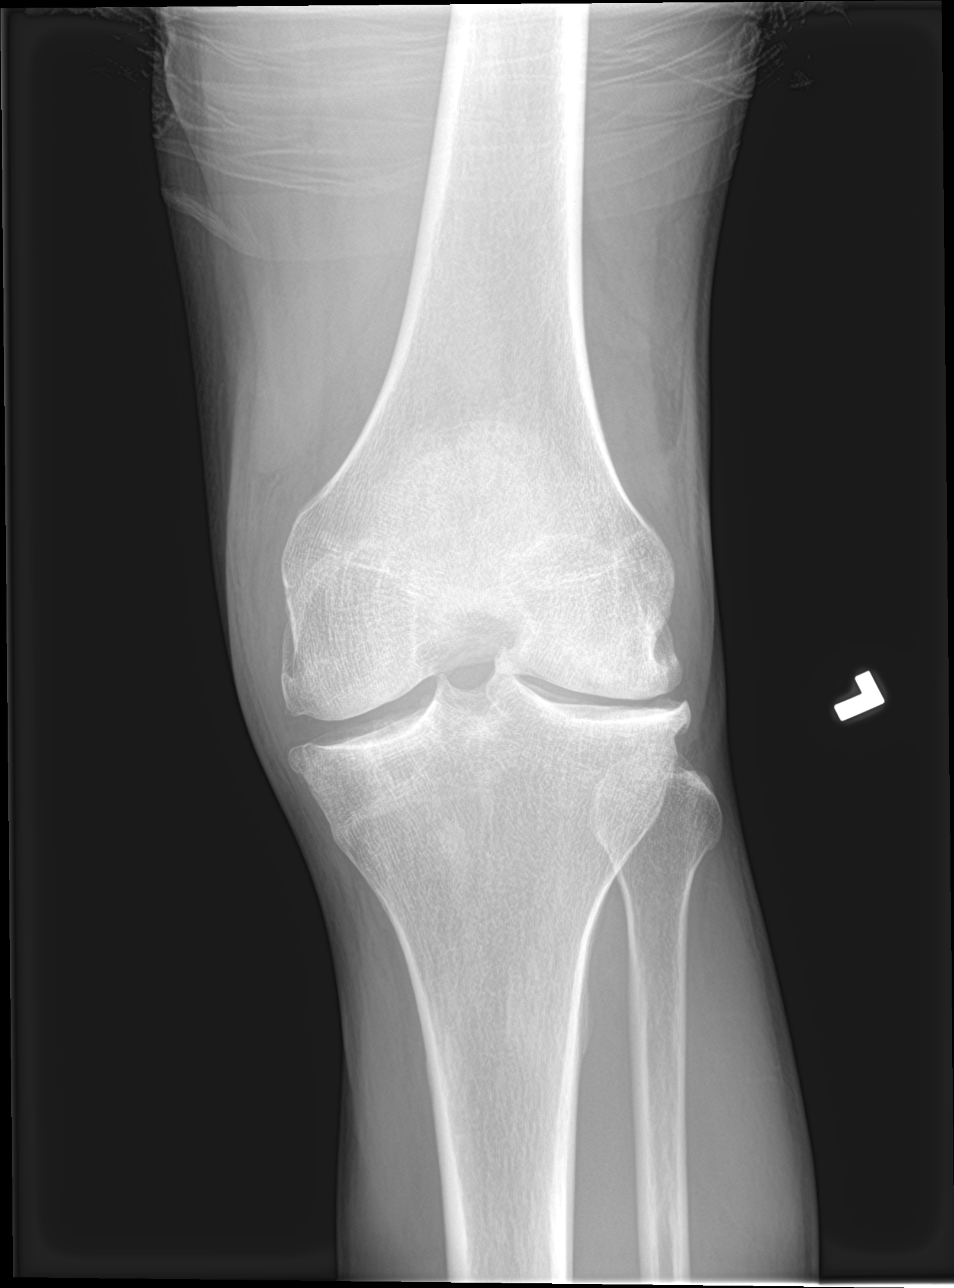

[knee obl (1 of 2)]
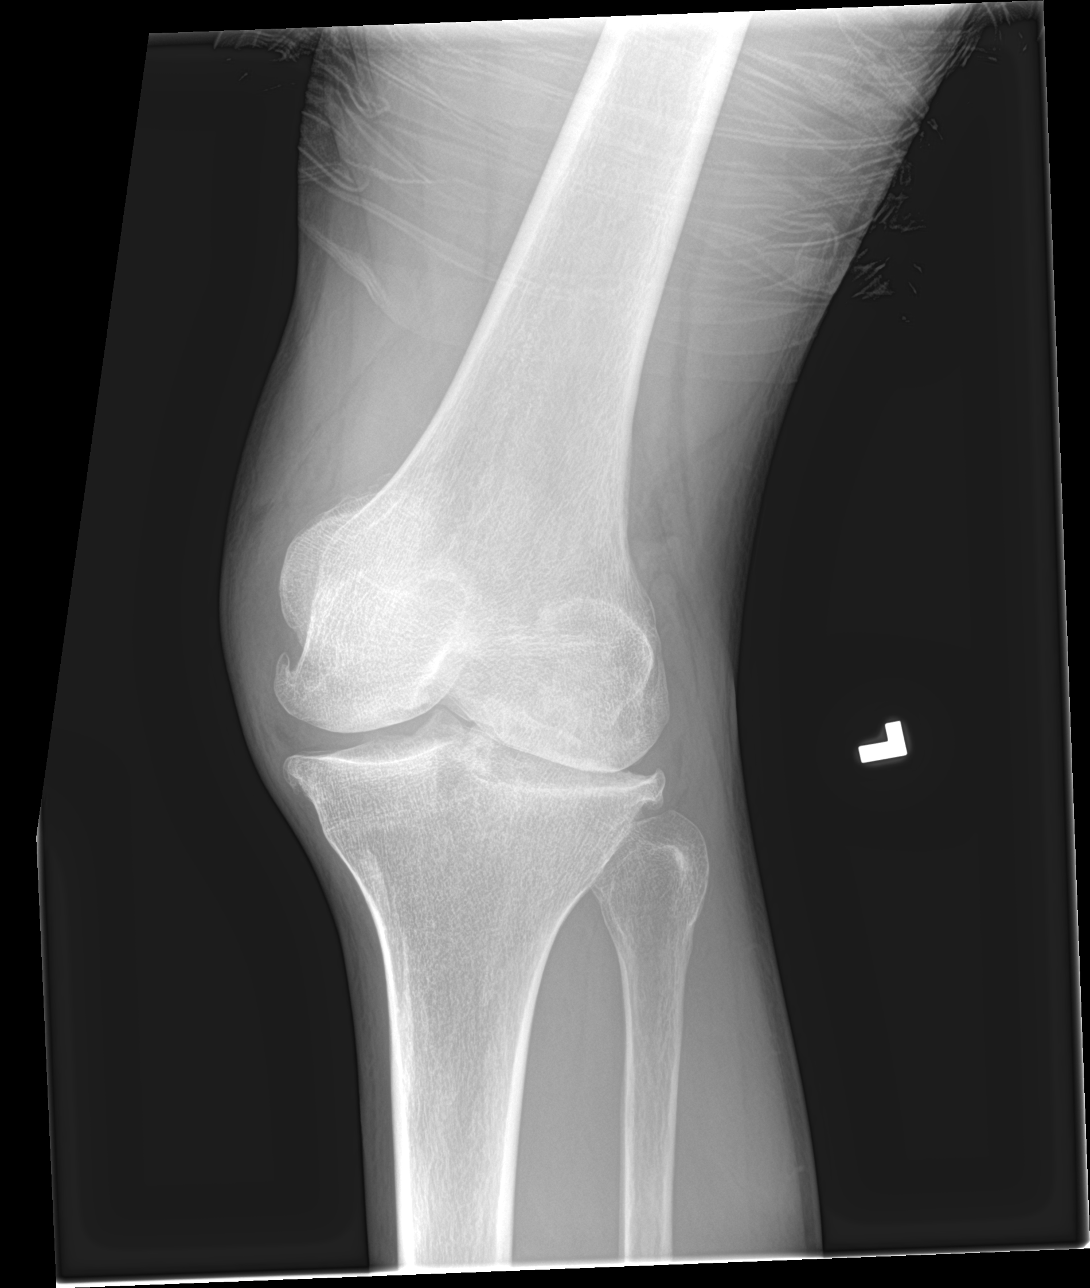

[knee obl (2 of 2)]
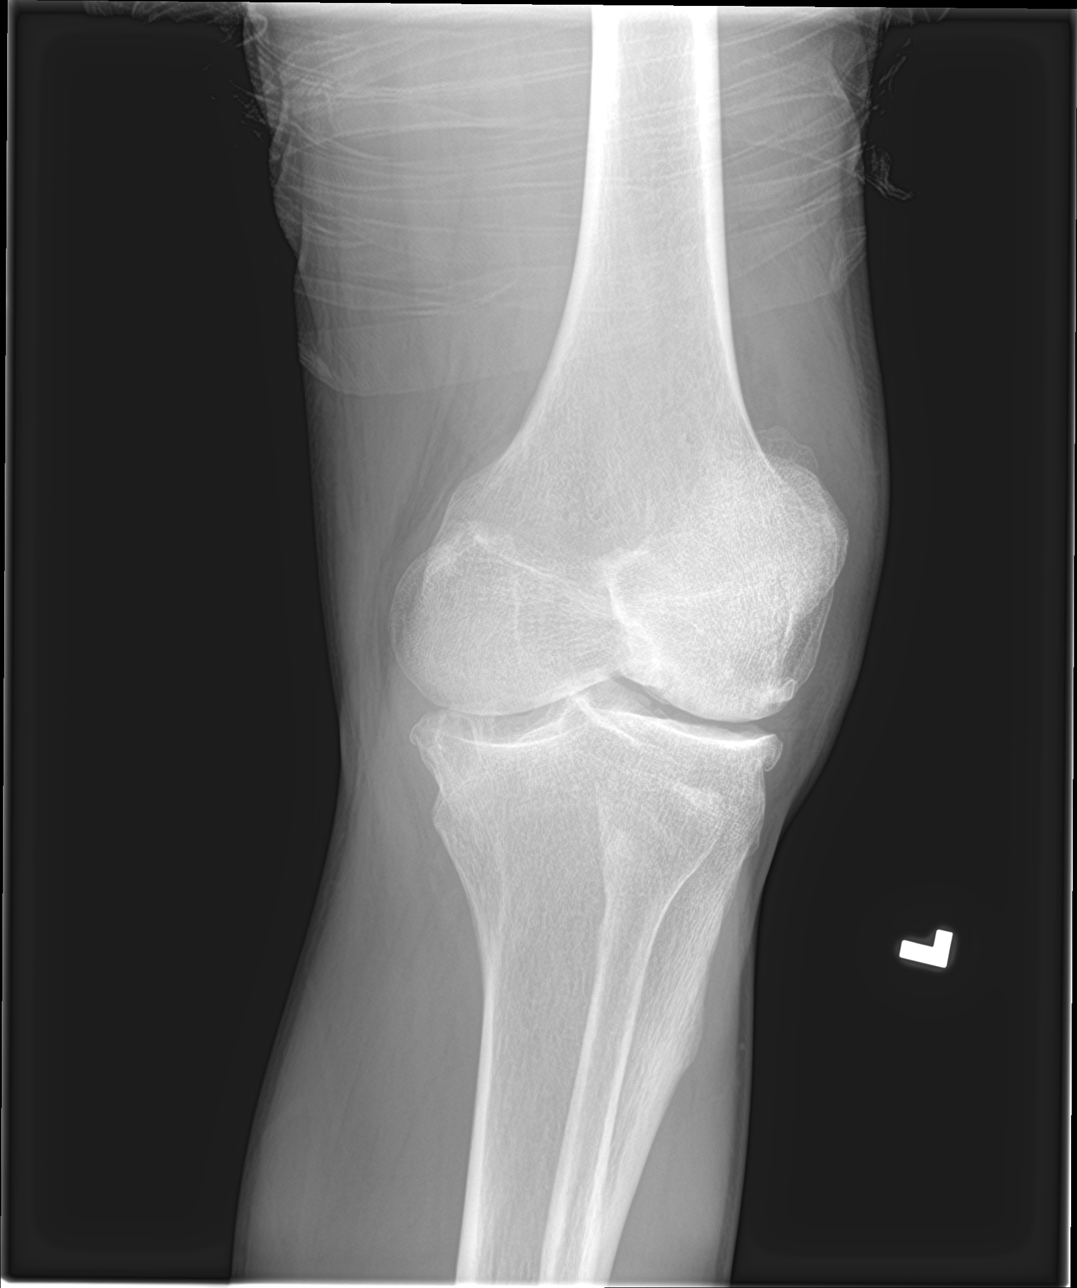

[knee lat]
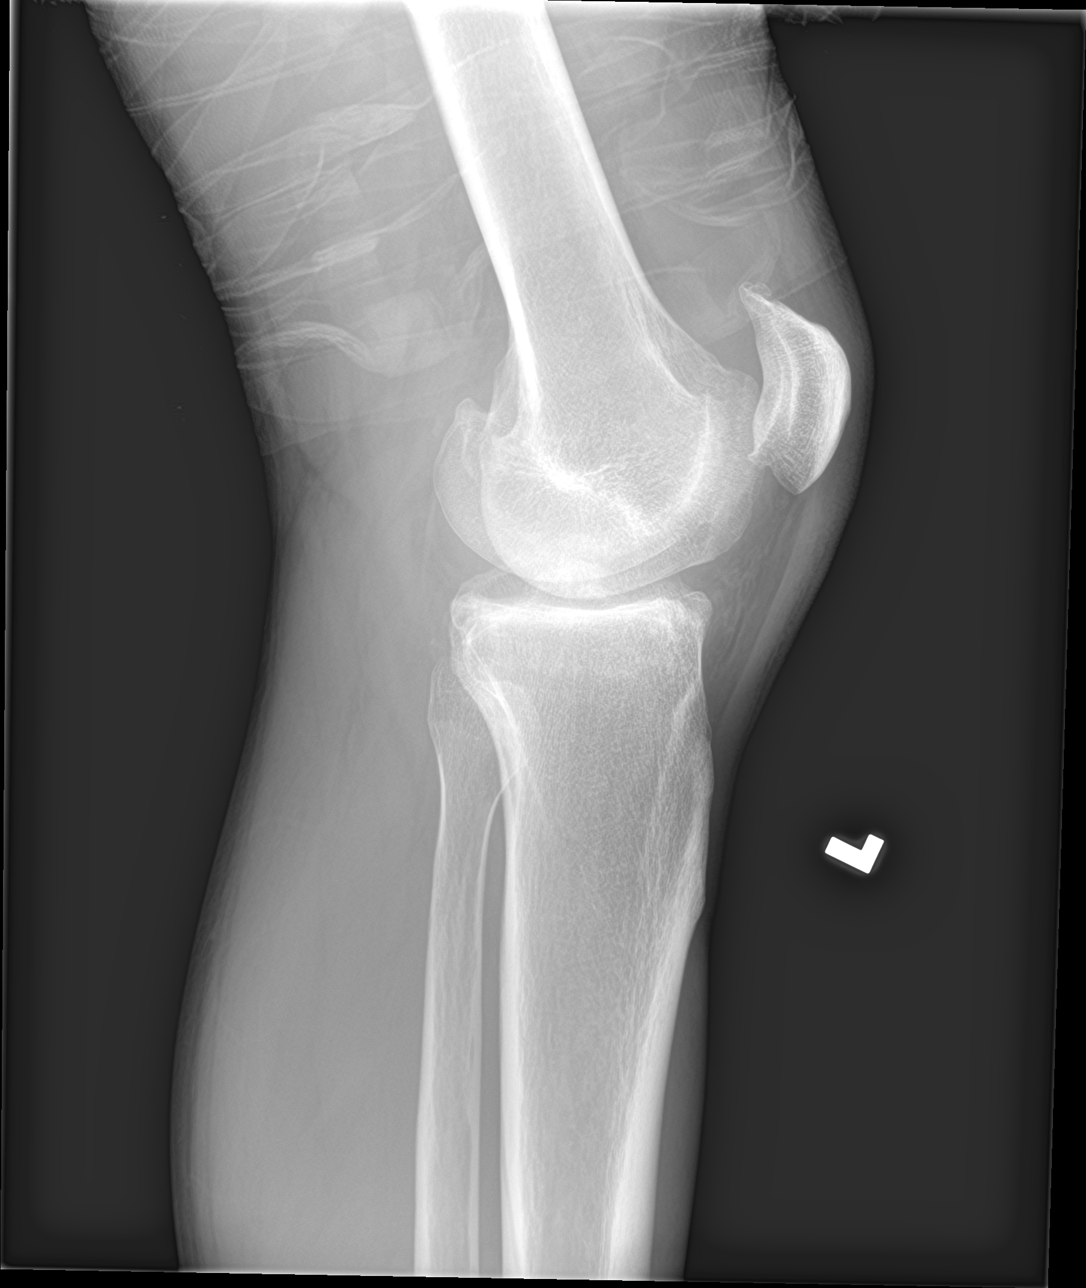

[4 of 4 positions shown; findings below may reference images not displayed]

FINDINGS: No fracture or malalignment. Moderate patellofemoral and lateral
joint space degenerative change with mild degenerative changes of
the medial joint space. Probable knee effusion though limited by
overlying artifact
IMPRESSION: 1. No definite acute osseous abnormality.
2. Suspected knee effusion though artifact obscures the
suprapatellar region
3. Arthritis

## 2021-01-13 ENCOUNTER — Other Ambulatory Visit: Payer: Self-pay

## 2021-01-13 ENCOUNTER — Encounter (HOSPITAL_COMMUNITY): Payer: Self-pay | Admitting: *Deleted

## 2021-01-13 ENCOUNTER — Emergency Department (HOSPITAL_COMMUNITY)
Admission: EM | Admit: 2021-01-13 | Discharge: 2021-01-13 | Disposition: A | Discharge status: Home / Self Care | Payer: Medicaid Other | Attending: Emergency Medicine | Admitting: Emergency Medicine

## 2021-01-13 DIAGNOSIS — Z202 Contact with and (suspected) exposure to infections with a predominantly sexual mode of transmission: Secondary | ICD-10-CM | POA: Insufficient documentation

## 2021-01-13 DIAGNOSIS — F17210 Nicotine dependence, cigarettes, uncomplicated: Secondary | ICD-10-CM | POA: Insufficient documentation

## 2021-01-13 MED ORDER — CEFTRIAXONE SODIUM 500 MG IJ SOLR
500.0000 mg | Freq: Once | INTRAMUSCULAR | Status: AC
  Administered 2021-01-13: 22:00:00 500 mg via INTRAMUSCULAR
  Filled 2021-01-13: qty 500

## 2021-01-13 MED ORDER — DOXYCYCLINE HYCLATE 100 MG PO CAPS
100.0000 mg | ORAL_CAPSULE | Freq: Two times a day (BID) | ORAL | 0 refills | Status: DC
Start: 2021-01-13 — End: 2021-01-13

## 2021-01-13 MED ORDER — DOXYCYCLINE HYCLATE 100 MG PO CAPS: 100.0000 mg | ORAL_CAPSULE | Freq: Two times a day (BID) | ORAL | 0 refills | Status: DC

## 2021-01-13 MED ORDER — STERILE WATER FOR INJECTION IJ SOLN
INTRAMUSCULAR | Status: AC
  Filled 2021-01-13: qty 10

## 2021-01-13 NOTE — Discharge Instructions (Addendum)
Check MyChart for the results of the urine gonorrhea and chlamydia screen. If positive take doxycycline twice daily for 10 days. In the future you can go to the health department for free screening. You should have a primary care doctor, information provided above.
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
## 2021-01-15 LAB — GC/CHLAMYDIA PROBE AMP (~~LOC~~) NOT AT ARMC
Chlamydia: NEGATIVE
Comment: NEGATIVE
Comment: NORMAL
Neisseria Gonorrhea: NEGATIVE

## 2021-11-17 ENCOUNTER — Emergency Department (HOSPITAL_COMMUNITY)
Admission: EM | Admit: 2021-11-17 | Discharge: 2021-11-17 | Discharge status: Against Medical Advice | Payer: Medicaid - Out of State | Attending: Emergency Medicine | Admitting: Emergency Medicine

## 2021-11-17 ENCOUNTER — Other Ambulatory Visit: Payer: Self-pay

## 2021-11-17 ENCOUNTER — Encounter (HOSPITAL_COMMUNITY): Payer: Self-pay

## 2021-11-17 DIAGNOSIS — Z5321 Procedure and treatment not carried out due to patient leaving prior to being seen by health care provider: Secondary | ICD-10-CM | POA: Diagnosis not present

## 2021-11-17 DIAGNOSIS — H5712 Ocular pain, left eye: Secondary | ICD-10-CM | POA: Diagnosis present

## 2021-11-17 NOTE — ED Triage Notes (Signed)
Pt presents with L eye pain x couple days. Pt states he woke up and it was red and hurting. Pt states he feels like something is in it. Denies trouble with vision.

## 2021-11-20 ENCOUNTER — Other Ambulatory Visit: Payer: Self-pay

## 2021-11-20 ENCOUNTER — Encounter (HOSPITAL_COMMUNITY): Payer: Self-pay | Admitting: Emergency Medicine

## 2021-11-20 ENCOUNTER — Emergency Department (HOSPITAL_COMMUNITY)
Admission: EM | Admit: 2021-11-20 | Discharge: 2021-11-21 | Disposition: A | Discharge status: Home / Self Care | Payer: Medicaid - Out of State | Attending: Emergency Medicine | Admitting: Emergency Medicine

## 2021-11-20 DIAGNOSIS — H5711 Ocular pain, right eye: Secondary | ICD-10-CM | POA: Diagnosis present

## 2021-11-20 MED ORDER — ERYTHROMYCIN 5 MG/GM OP OINT
TOPICAL_OINTMENT | Freq: Once | OPHTHALMIC | Status: AC
  Administered 2021-11-20: 1 via OPHTHALMIC
  Filled 2021-11-20: qty 3.5

## 2021-11-20 MED ORDER — FLUORESCEIN SODIUM 1 MG OP STRP
1.0000 | ORAL_STRIP | Freq: Once | OPHTHALMIC | Status: AC
  Administered 2021-11-20: 1 via OPHTHALMIC
  Filled 2021-11-20: qty 1

## 2021-11-20 MED ORDER — TETRACAINE HCL 0.5 % OP SOLN
1.0000 [drp] | Freq: Once | OPHTHALMIC | Status: AC
  Administered 2021-11-20: 1 [drp] via OPHTHALMIC
  Filled 2021-11-20: qty 4

## 2021-11-20 NOTE — ED Triage Notes (Signed)
Pt woke up 3xdays ago with right eye pain and swelling. Right eye sclera is red. Pt used eye drops to flush it but still feels irritated. Pt states feel like something is under top eyelid.

## 2021-11-20 NOTE — ED Notes (Signed)
ED Provider at bedside. 

## 2021-11-21 NOTE — ED Provider Notes (Signed)
[facility] EMERGENCY DEPARTMENT Provider Note   CSN: [id number] Arrival date & time: 11/20/21  2145     History  Chief Complaint  Patient presents with   Eye Pain    Jesse Daniels is a 41 y.o. male.  41 year old male who presents the ER today with right-sided eye pain and foreign body sensation for last 2 to 3 days.  No change.  Was not doing thing to get a thing in his eye.  States he woke up like this 1 morning.  He has tried some flushing drops which did not seem to help.  Lipid blurry vision but when he blinks a few times it is normal and intact.  No trauma.   Eye Pain       Home Medications Prior to Admission medications   Medication Sig Start Date End Date Taking? Authorizing Provider  diclofenac (VOLTAREN) 75 MG EC tablet Take 1 tablet (75 mg total) by mouth 2 (two) times daily. Take with food 05/08/19   Triplett, Tammy, PA-C  doxycycline (VIBRAMYCIN) 100 MG capsule Take 1 capsule (100 mg total) by mouth 2 (two) times daily. 01/13/21   Theron Arista, PA-C      Allergies    Patient has no known allergies.    Review of Systems   Review of Systems  Eyes:  Positive for pain.    Physical Exam Updated Vital Signs BP 120/74 (BP Location: Right Arm)   Pulse 92   Temp 98.2 F (36.8 C) (Oral)   Resp 14   Ht 5\' 9"  (1.753 m)   Wt 63.5 kg   SpO2 99%   BMI 20.67 kg/m  Physical Exam Vitals and nursing note reviewed.  Constitutional:      Appearance: He is well-developed.  HENT:     Head: Normocephalic and atraumatic.     Mouth/Throat:     Mouth: Mucous membranes are moist.  Eyes:     Pupils: Pupils are equal, round, and reactive to light.     Comments: Tetracaine relieved his symptoms.  His right eye pupil is reactive to light.  He has injected conjunctive a.  Tono-Pen pressure was 19 on that side.  Fluorescein shows some uptake on each side of his cornea but no foreign bodies.  Cardiovascular:     Rate and Rhythm: Normal rate.  Pulmonary:     Effort:  Pulmonary effort is normal. No respiratory distress.  Abdominal:     General: Abdomen is flat. There is no distension.     Tenderness: There is no abdominal tenderness.  Musculoskeletal:        General: Normal range of motion.     Cervical back: Normal range of motion.  Skin:    General: Skin is warm and dry.  Neurological:     General: No focal deficit present.     Mental Status: He is alert.     ED Results / Procedures / Treatments   Labs (all labs ordered are listed, but only abnormal results are displayed) Labs Reviewed - No data to display  EKG None  Radiology No results found.  Procedures Procedures    Medications Ordered in ED Medications  tetracaine (PONTOCAINE) 0.5 % ophthalmic solution 1 drop (1 drop Right Eye Given 11/20/21 2331)  fluorescein ophthalmic strip 1 strip (1 strip Right Eye Given 11/20/21 2345)  erythromycin ophthalmic ointment (1 Application Right Eye Given 11/20/21 2357)    ED Course/ Medical Decision Making/ A&P  Medical Decision Making Risk Prescription drug management.   Suspect corneal abrasion may have had an eyelash or something in there the other day that caused the irritation but no foreign bodies noted at this time.  Erythromycin given for prophylaxis.  Optho follow-up suggested if not improved in 2 to 3 days.  Final Clinical Impression(s) / ED Diagnoses Final diagnoses:  Pain of right eye    Rx / DC Orders ED Discharge Orders          Ordered    Ambulatory referral to Ophthalmology        11/20/21 2357              Brayden Betters, Corene Cornea, MD 11/21/21 [phone]

## 2021-12-08 ENCOUNTER — Telehealth (HOSPITAL_COMMUNITY): Payer: Self-pay | Admitting: *Deleted

## 2021-12-08 NOTE — Telephone Encounter (Signed)
Opened in error

## 2021-12-24 ENCOUNTER — Emergency Department (HOSPITAL_COMMUNITY)
Admission: EM | Admit: 2021-12-24 | Discharge: 2021-12-24 | Disposition: A | Discharge status: Home / Self Care | Payer: Self-pay | Attending: Emergency Medicine | Admitting: Emergency Medicine

## 2021-12-24 ENCOUNTER — Other Ambulatory Visit: Payer: Self-pay

## 2021-12-24 ENCOUNTER — Emergency Department (HOSPITAL_COMMUNITY): Payer: Self-pay

## 2021-12-24 DIAGNOSIS — N451 Epididymitis: Secondary | ICD-10-CM | POA: Insufficient documentation

## 2021-12-24 LAB — URINALYSIS, ROUTINE W REFLEX MICROSCOPIC
Bilirubin Urine: NEGATIVE
Glucose, UA: NEGATIVE mg/dL
Ketones, ur: NEGATIVE mg/dL
Nitrite: NEGATIVE
Protein, ur: 100 mg/dL — AB
RBC / HPF: >50 RBC/hpf — ABNORMAL HIGH (ref 0–5)
Specific Gravity, Urine: 1.027 (ref 1.005–1.030)
WBC, UA: >50 WBC/hpf — ABNORMAL HIGH (ref 0–5)
pH: 5 (ref 5.0–8.0)

## 2021-12-24 MED ORDER — CEFTRIAXONE SODIUM 500 MG IJ SOLR
500.0000 mg | Freq: Once | INTRAMUSCULAR | Status: AC
  Administered 2021-12-24: 500 mg via INTRAMUSCULAR
  Filled 2021-12-24: qty 500

## 2021-12-24 MED ORDER — STERILE WATER FOR INJECTION IJ SOLN
INTRAMUSCULAR | Status: AC
  Administered 2021-12-24: 10 mL
  Filled 2021-12-24: qty 10

## 2021-12-24 MED ORDER — DOXYCYCLINE HYCLATE 100 MG PO TABS
100.0000 mg | ORAL_TABLET | Freq: Once | ORAL | Status: AC
  Administered 2021-12-24: 100 mg via ORAL
  Filled 2021-12-24: qty 1

## 2021-12-24 MED ORDER — DOXYCYCLINE HYCLATE 100 MG PO CAPS: 100.0000 mg | ORAL_CAPSULE | Freq: Two times a day (BID) | ORAL | 0 refills | Status: DC

## 2021-12-24 NOTE — Discharge Instructions (Addendum)
You were seen in the emergency department for your testicle pain and swelling.  Your workup shows that you have epididymitis which is inflammation and infection to a part of your testicle.  This is usually caused by STDs we did test you today for gonorrhea and chlamydia.  You can follow-up with these results on your patient portal.  This is treated with antibiotics and you should complete them as prescribed.  You should abstain from sex for at least 7 days after completing antibiotics to prevent reinfection and recommend that your partners to be tested and treated as well.  You can follow-up with your primary doctor or with urology to have your symptoms rechecked.  You should return to the emergency department for significantly worsening pain, fevers, repetitive vomiting or if you have any other new or concerning symptoms.
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
## 2021-12-24 NOTE — ED Triage Notes (Signed)
Pt via POV c/o left testicular pain x 2 days with some swelling. He states he has some pain radiating to LLQ abdomen. Denies STD concerns but notes that he did see what looked like white sperm and blood in toilet after voiding. Pain currently rated 9/10.

## 2021-12-25 LAB — GC/CHLAMYDIA PROBE AMP (~~LOC~~) NOT AT ARMC
Chlamydia: NEGATIVE
Comment: NEGATIVE
Comment: NORMAL
Neisseria Gonorrhea: NEGATIVE

## 2022-05-11 ENCOUNTER — Other Ambulatory Visit: Payer: Self-pay

## 2022-05-11 ENCOUNTER — Emergency Department (HOSPITAL_COMMUNITY)
Admission: EM | Admit: 2022-05-11 | Discharge: 2022-05-12 | Disposition: A | Discharge status: Home / Self Care | Payer: Medicaid - Out of State | Attending: Emergency Medicine | Admitting: Emergency Medicine

## 2022-05-11 DIAGNOSIS — N342 Other urethritis: Secondary | ICD-10-CM | POA: Insufficient documentation
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
## 2022-05-12 LAB — HIV ANTIBODY (ROUTINE TESTING W REFLEX): HIV Screen 4th Generation wRfx: NONREACTIVE

## 2022-05-12 LAB — RPR: RPR Ser Ql: NONREACTIVE

## 2022-05-12 MED ORDER — CEFTRIAXONE SODIUM 500 MG IJ SOLR
500.0000 mg | Freq: Once | INTRAMUSCULAR | Status: AC
  Administered 2022-05-12: 500 mg via INTRAMUSCULAR
  Filled 2022-05-12: qty 500

## 2022-05-12 MED ORDER — DOXYCYCLINE HYCLATE 100 MG PO TABS
100.0000 mg | ORAL_TABLET | Freq: Once | ORAL | Status: AC
  Administered 2022-05-12: 100 mg via ORAL
  Filled 2022-05-12: qty 1

## 2022-05-12 MED ORDER — DOXYCYCLINE HYCLATE 100 MG PO CAPS: 100.0000 mg | ORAL_CAPSULE | Freq: Two times a day (BID) | ORAL | 0 refills | Status: AC
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
## 2022-05-13 LAB — GC/CHLAMYDIA PROBE AMP (~~LOC~~) NOT AT ARMC
Chlamydia: POSITIVE — AB
Comment: NEGATIVE
Comment: NORMAL
Neisseria Gonorrhea: NEGATIVE

## 2022-09-13 ENCOUNTER — Other Ambulatory Visit: Payer: Self-pay

## 2022-09-13 ENCOUNTER — Emergency Department (HOSPITAL_COMMUNITY)
Admission: EM | Admit: 2022-09-13 | Discharge: 2022-09-13 | Disposition: A | Discharge status: Home / Self Care | Payer: Self-pay | Attending: Emergency Medicine | Admitting: Emergency Medicine

## 2022-09-13 DIAGNOSIS — S0501XA Injury of conjunctiva and corneal abrasion without foreign body, right eye, initial encounter: Secondary | ICD-10-CM | POA: Insufficient documentation

## 2022-09-13 DIAGNOSIS — Y9389 Activity, other specified: Secondary | ICD-10-CM | POA: Insufficient documentation

## 2022-09-13 DIAGNOSIS — Y92007 Garden or yard of unspecified non-institutional (private) residence as the place of occurrence of the external cause: Secondary | ICD-10-CM | POA: Insufficient documentation

## 2022-09-13 DIAGNOSIS — X58XXXA Exposure to other specified factors, initial encounter: Secondary | ICD-10-CM | POA: Insufficient documentation

## 2022-09-13 MED ORDER — ERYTHROMYCIN 5 MG/GM OP OINT
TOPICAL_OINTMENT | Freq: Once | OPHTHALMIC | Status: AC
  Filled 2022-09-13: qty 3.5

## 2022-09-13 MED ORDER — TETRACAINE HCL 0.5 % OP SOLN
2.0000 [drp] | Freq: Once | OPHTHALMIC | Status: AC
  Administered 2022-09-13: 2 [drp] via OPHTHALMIC
  Filled 2022-09-13: qty 4

## 2022-09-13 MED ORDER — ERYTHROMYCIN 5 MG/GM OP OINT: TOPICAL_OINTMENT | Freq: Four times a day (QID) | OPHTHALMIC | 0 refills | Status: AC

## 2022-09-13 MED ORDER — IBUPROFEN 600 MG PO TABS: 600.0000 mg | ORAL_TABLET | Freq: Four times a day (QID) | ORAL | 0 refills | Status: AC

## 2022-09-13 MED ORDER — FLUORESCEIN SODIUM 1 MG OP STRP
1.0000 | ORAL_STRIP | Freq: Once | OPHTHALMIC | Status: AC
  Administered 2022-09-13: 1 via OPHTHALMIC
  Filled 2022-09-13: qty 1

## 2022-09-13 NOTE — Discharge Instructions (Addendum)
You were seen in the emergency room for eye pain. Our evaluation indicates that you have corneal abrasion.  Read the instructions provided on this condition.  You will need to apply the antibiotic ointment to your eye 4 times a day. Take ibuprofen 600 mg every 6 hours as well.  Please call the ophthalmologist at the number provided and see them later this week. Return to the emergency room if you start having worsening pain, vision loss.

## 2022-09-13 NOTE — ED Provider Notes (Signed)
[place] EMERGENCY DEPARTMENT AT [facility] Provider Note   CSN: [id number] Arrival date & time: 09/13/22  1308     History  Chief Complaint  Patient presents with   Eye Injury    Jesse Daniels is a 42 y.o. male.  HPI    SUBJECTIVE:  42 y.o. male with burning, redness, discharge and mattering in right eye for 3 days.  No other symptoms.  No significant prior ophthalmological history.  Patient states that he was doing some yard work when something got in his eye.  He has had discomfort ever since.  There is significant light sensitivity at this time.  He has been tearing significantly as well.  No purulent drainage.  He does not use contacts.  Patient has blurry vision.    Home Medications Prior to Admission medications   Medication Sig Start Date End Date Taking? Authorizing Provider  erythromycin ophthalmic ointment Place into the right eye 4 (four) times daily for 5 days. Place a 1/2 inch ribbon of ointment into the lower eyelid. 09/13/22 09/18/22 Yes Derwood Kaplan, MD  ibuprofen (ADVIL) 600 MG tablet Take 1 tablet (600 mg total) by mouth 4 (four) times daily. 09/13/22  Yes Derwood Kaplan, MD  acetaminophen (TYLENOL) 650 MG CR tablet Take 1,300 mg by mouth every 8 (eight) hours as needed for pain.    [provider]  doxycycline (VIBRAMYCIN) 100 MG capsule Take 1 capsule (100 mg total) by mouth 2 (two) times daily. 05/12/22   Dione Booze, MD      Allergies    Patient has no known allergies.    Review of Systems   Review of Systems  All other systems reviewed and are negative.   Physical Exam Updated Vital Signs BP 117/85   Pulse 63   Temp 98.6 F (37 C) (Oral)   Resp 17   Ht 5\' 9"  (1.753 m)   Wt 63.5 kg   SpO2 100%   BMI 20.67 kg/m  Physical Exam Vitals and nursing note reviewed.  Constitutional:      Appearance: He is well-developed.  HENT:     Head: Atraumatic.  Eyes:     Comments: Patient's red eye has chemosis, pupil is 2 mm  and equal, extraocular muscles intact.  After application of tetracaine, we did not see any direct foreign body. With fluorescein evaluation, patient has punctate dye uptake in the lower part of his cornea in 2 different locations.  No clear evidence of hyphema  Bedside finger counting is normal  Cardiovascular:     Rate and Rhythm: Normal rate.  Pulmonary:     Effort: Pulmonary effort is normal.  Musculoskeletal:     Cervical back: Neck supple.  Skin:    General: Skin is warm.  Neurological:     Mental Status: He is alert and oriented to person, place, and time.     ED Results / Procedures / Treatments   Labs (all labs ordered are listed, but only abnormal results are displayed) Labs Reviewed - No data to display  EKG None  Radiology No results found.  Procedures Procedures    Medications Ordered in ED Medications  tetracaine (PONTOCAINE) 0.5 % ophthalmic solution 2 drop (has no administration in time range)  fluorescein ophthalmic strip 1 strip (has no administration in time range)  erythromycin ophthalmic ointment (has no administration in time range)    ED Course/ Medical Decision Making/ A&P  Medical Decision Making Risk Prescription drug management.  42 year old male comes in with chief complaint of eye pain and headaches and visual disturbance.  Symptoms started after he was struck in eye with something while he was doing yard work.  Differential diagnosis considered includes corneal abrasion, corneal ulcer, glaucoma, conjunctivitis  ASSESSMENT:  Corneal abrasion  PLAN:  Antibiotic drops per order. Hygiene discussed.  Return precautions discussed. Final Clinical Impression(s) / ED Diagnoses Final diagnoses:  Abrasion of right cornea, initial encounter    Rx / DC Orders ED Discharge Orders          Ordered    erythromycin ophthalmic ointment  4 times daily        09/13/22 0836    ibuprofen (ADVIL) 600 MG  tablet  4 times daily        09/13/22 0836              Derwood Kaplan, MD 09/13/22 [phone]

## 2022-09-13 NOTE — ED Triage Notes (Signed)
Pt was weed eating on Friday and something got into his eye. Right eye is red, watering , hurts to open it and light hurts his eye. Pt used visine to try and flush it but has not helped.

## 2022-10-29 ENCOUNTER — Telehealth: Payer: Self-pay

## 2022-10-29 NOTE — Telephone Encounter (Signed)
Patient answer the phone, introduce myself offer  [facility] program to him, send text with information to patients cell.
